# Patient Record
Sex: Female | Born: 1935 | Race: Black or African American | Hispanic: No | Smoking: Never smoker
Health system: Southern US, Community
[De-identification: ages and names within clinical notes are randomized; demographics above are authoritative.]

## PROBLEM LIST (undated history)

## (undated) DIAGNOSIS — I10 Essential (primary) hypertension: Secondary | ICD-10-CM

## (undated) DIAGNOSIS — M1990 Unspecified osteoarthritis, unspecified site: Secondary | ICD-10-CM

## (undated) DIAGNOSIS — E785 Hyperlipidemia, unspecified: Secondary | ICD-10-CM

## (undated) DIAGNOSIS — R413 Other amnesia: Secondary | ICD-10-CM

## (undated) DIAGNOSIS — F0390 Unspecified dementia without behavioral disturbance: Secondary | ICD-10-CM

## (undated) HISTORY — DX: Other amnesia: R41.3

## (undated) HISTORY — DX: Essential (primary) hypertension: I10

## (undated) HISTORY — PX: CATARACT EXTRACTION: SUR2

## (undated) HISTORY — DX: Hyperlipidemia, unspecified: E78.5

## (undated) HISTORY — DX: Unspecified osteoarthritis, unspecified site: M19.90

---

## 1978-05-08 DIAGNOSIS — I10 Essential (primary) hypertension: Secondary | ICD-10-CM

## 1978-05-08 HISTORY — DX: Essential (primary) hypertension: I10

## 1985-07-04 HISTORY — PX: ABDOMINAL HYSTERECTOMY: SHX81

## 1995-03-09 DIAGNOSIS — E785 Hyperlipidemia, unspecified: Secondary | ICD-10-CM

## 1995-03-09 HISTORY — DX: Hyperlipidemia, unspecified: E78.5

## 1999-01-12 ENCOUNTER — Other Ambulatory Visit: Admission: RE | Admit: 1999-01-12 | Discharge: 1999-01-12 | Payer: Self-pay | Admitting: Family Medicine

## 1999-12-16 ENCOUNTER — Encounter: Payer: Self-pay | Admitting: Family Medicine

## 1999-12-16 ENCOUNTER — Encounter: Admission: RE | Admit: 1999-12-16 | Discharge: 1999-12-16 | Payer: Self-pay | Admitting: Family Medicine

## 2000-12-17 ENCOUNTER — Encounter: Payer: Self-pay | Admitting: Family Medicine

## 2000-12-17 ENCOUNTER — Encounter: Admission: RE | Admit: 2000-12-17 | Discharge: 2000-12-17 | Payer: Self-pay | Admitting: Family Medicine

## 2001-06-08 ENCOUNTER — Encounter: Payer: Self-pay | Admitting: Family Medicine

## 2001-06-08 DIAGNOSIS — R739 Hyperglycemia, unspecified: Secondary | ICD-10-CM

## 2001-10-06 ENCOUNTER — Encounter: Payer: Self-pay | Admitting: Family Medicine

## 2001-12-18 ENCOUNTER — Encounter: Admission: RE | Admit: 2001-12-18 | Discharge: 2001-12-18 | Payer: Self-pay | Admitting: Family Medicine

## 2001-12-18 ENCOUNTER — Encounter: Payer: Self-pay | Admitting: Family Medicine

## 2002-07-07 ENCOUNTER — Encounter: Payer: Self-pay | Admitting: Family Medicine

## 2002-07-07 LAB — CONVERTED CEMR LAB: Hgb A1c MFr Bld: 6.2 %

## 2002-12-23 ENCOUNTER — Encounter: Admission: RE | Admit: 2002-12-23 | Discharge: 2002-12-23 | Payer: Self-pay | Admitting: Family Medicine

## 2002-12-23 ENCOUNTER — Encounter: Payer: Self-pay | Admitting: Family Medicine

## 2003-01-07 ENCOUNTER — Encounter: Payer: Self-pay | Admitting: Family Medicine

## 2003-01-07 LAB — CONVERTED CEMR LAB: Microalbumin U total vol: 2.1 mg/L

## 2003-12-25 ENCOUNTER — Encounter: Admission: RE | Admit: 2003-12-25 | Discharge: 2003-12-25 | Payer: Self-pay | Admitting: Family Medicine

## 2004-01-07 ENCOUNTER — Encounter: Payer: Self-pay | Admitting: Family Medicine

## 2004-02-03 ENCOUNTER — Other Ambulatory Visit: Admission: RE | Admit: 2004-02-03 | Discharge: 2004-02-03 | Payer: Self-pay | Admitting: Family Medicine

## 2004-04-15 ENCOUNTER — Ambulatory Visit: Payer: Self-pay | Admitting: Family Medicine

## 2004-07-06 ENCOUNTER — Encounter: Payer: Self-pay | Admitting: Family Medicine

## 2004-07-06 LAB — CONVERTED CEMR LAB: Hgb A1c MFr Bld: 5.3 %

## 2004-07-19 ENCOUNTER — Ambulatory Visit: Payer: Self-pay | Admitting: Family Medicine

## 2004-07-20 ENCOUNTER — Ambulatory Visit: Payer: Self-pay | Admitting: Family Medicine

## 2005-01-06 ENCOUNTER — Encounter: Payer: Self-pay | Admitting: Family Medicine

## 2005-01-06 LAB — CONVERTED CEMR LAB: Microalbumin U total vol: 2.1 mg/L

## 2005-01-19 ENCOUNTER — Ambulatory Visit: Payer: Self-pay | Admitting: Family Medicine

## 2005-01-24 ENCOUNTER — Ambulatory Visit: Payer: Self-pay | Admitting: Family Medicine

## 2005-01-25 ENCOUNTER — Encounter: Admission: RE | Admit: 2005-01-25 | Discharge: 2005-01-25 | Payer: Self-pay | Admitting: Family Medicine

## 2005-02-20 ENCOUNTER — Ambulatory Visit: Payer: Self-pay | Admitting: Family Medicine

## 2005-04-03 ENCOUNTER — Ambulatory Visit: Payer: Self-pay | Admitting: Family Medicine

## 2005-07-06 ENCOUNTER — Encounter: Payer: Self-pay | Admitting: Family Medicine

## 2005-07-06 LAB — CONVERTED CEMR LAB: Hgb A1c MFr Bld: 6.2 %

## 2005-07-20 ENCOUNTER — Ambulatory Visit: Payer: Self-pay | Admitting: Family Medicine

## 2005-07-24 ENCOUNTER — Ambulatory Visit: Payer: Self-pay | Admitting: Family Medicine

## 2006-01-29 ENCOUNTER — Encounter: Admission: RE | Admit: 2006-01-29 | Discharge: 2006-01-29 | Payer: Self-pay | Admitting: Family Medicine

## 2006-02-05 ENCOUNTER — Encounter: Payer: Self-pay | Admitting: Family Medicine

## 2006-02-12 ENCOUNTER — Ambulatory Visit: Payer: Self-pay | Admitting: Family Medicine

## 2006-02-22 ENCOUNTER — Ambulatory Visit: Payer: Self-pay | Admitting: Family Medicine

## 2006-03-19 ENCOUNTER — Ambulatory Visit: Payer: Self-pay | Admitting: Family Medicine

## 2006-08-07 ENCOUNTER — Encounter: Payer: Self-pay | Admitting: Family Medicine

## 2006-08-24 ENCOUNTER — Ambulatory Visit: Payer: Self-pay | Admitting: Family Medicine

## 2006-08-24 LAB — CONVERTED CEMR LAB
Basophils Absolute: 0 10*3/uL (ref 0.0–0.1)
Eosinophils Absolute: 0.1 10*3/uL (ref 0.0–0.6)
GFR calc Af Amer: 57 mL/min
GFR calc non Af Amer: 47 mL/min
HCT: 35.4 % — ABNORMAL LOW (ref 36.0–46.0)
Hemoglobin: 11.7 g/dL — ABNORMAL LOW (ref 12.0–15.0)
Hgb A1c MFr Bld: 6.1 % — ABNORMAL HIGH (ref 4.6–6.0)
MCHC: 33.1 g/dL (ref 30.0–36.0)
MCV: 84.9 fL (ref 78.0–100.0)
Monocytes Absolute: 0.5 10*3/uL (ref 0.2–0.7)
Neutro Abs: 1.7 10*3/uL (ref 1.4–7.7)
Neutrophils Relative %: 46.8 % (ref 43.0–77.0)
Potassium: 3.8 meq/L (ref 3.5–5.1)
Sodium: 145 meq/L (ref 135–145)

## 2006-08-29 ENCOUNTER — Ambulatory Visit: Payer: Self-pay | Admitting: Family Medicine

## 2007-01-24 ENCOUNTER — Encounter: Payer: Self-pay | Admitting: Family Medicine

## 2007-01-31 ENCOUNTER — Encounter: Admission: RE | Admit: 2007-01-31 | Discharge: 2007-01-31 | Payer: Self-pay | Admitting: Family Medicine

## 2007-02-04 ENCOUNTER — Encounter (INDEPENDENT_AMBULATORY_CARE_PROVIDER_SITE_OTHER): Payer: Self-pay | Admitting: *Deleted

## 2007-02-26 ENCOUNTER — Ambulatory Visit: Payer: Self-pay | Admitting: Family Medicine

## 2007-02-26 LAB — CONVERTED CEMR LAB
ALT: 16 units/L (ref 0–35)
AST: 14 units/L (ref 0–37)
Alkaline Phosphatase: 39 units/L (ref 39–117)
BUN: 18 mg/dL (ref 6–23)
Basophils Relative: 0.4 % (ref 0.0–1.0)
CO2: 27 meq/L (ref 19–32)
Calcium: 9.1 mg/dL (ref 8.4–10.5)
Chloride: 107 meq/L (ref 96–112)
Creatinine, Ser: 1.3 mg/dL — ABNORMAL HIGH (ref 0.4–1.2)
HDL: 47.4 mg/dL (ref >39.0)
Hemoglobin: 12 g/dL (ref 12.0–15.0)
LDL Cholesterol: 55 mg/dL (ref 0–99)
Monocytes Absolute: 0.7 10*3/uL (ref 0.2–0.7)
Monocytes Relative: 18 % — ABNORMAL HIGH (ref 3.0–11.0)
Potassium: 3.8 meq/L (ref 3.5–5.1)
RDW: 13.1 % (ref 11.5–14.6)
Total Bilirubin: 1.3 mg/dL — ABNORMAL HIGH (ref 0.3–1.2)
Total Protein: 7 g/dL (ref 6.0–8.3)
VLDL: 10 mg/dL (ref 0–40)

## 2007-02-28 ENCOUNTER — Ambulatory Visit: Payer: Self-pay | Admitting: Family Medicine

## 2007-02-28 DIAGNOSIS — M129 Arthropathy, unspecified: Secondary | ICD-10-CM | POA: Insufficient documentation

## 2007-03-26 ENCOUNTER — Ambulatory Visit: Payer: Self-pay | Admitting: Family Medicine

## 2007-03-27 ENCOUNTER — Encounter (INDEPENDENT_AMBULATORY_CARE_PROVIDER_SITE_OTHER): Payer: Self-pay | Admitting: *Deleted

## 2007-08-22 ENCOUNTER — Ambulatory Visit: Payer: Self-pay | Admitting: Family Medicine

## 2007-08-22 LAB — CONVERTED CEMR LAB
BUN: 20 mg/dL (ref 6–23)
Hgb A1c MFr Bld: 6.3 % — ABNORMAL HIGH (ref 4.6–6.0)

## 2007-08-26 ENCOUNTER — Ambulatory Visit: Payer: Self-pay | Admitting: Family Medicine

## 2008-02-03 ENCOUNTER — Encounter: Admission: RE | Admit: 2008-02-03 | Discharge: 2008-02-03 | Payer: Self-pay | Admitting: Family Medicine

## 2008-02-05 ENCOUNTER — Encounter (INDEPENDENT_AMBULATORY_CARE_PROVIDER_SITE_OTHER): Payer: Self-pay | Admitting: *Deleted

## 2008-02-27 ENCOUNTER — Ambulatory Visit: Payer: Self-pay | Admitting: Family Medicine

## 2008-02-27 LAB — CONVERTED CEMR LAB
ALT: 16 units/L (ref 0–35)
Albumin: 3.5 g/dL (ref 3.5–5.2)
Basophils Absolute: 0 10*3/uL (ref 0.0–0.1)
Basophils Relative: 0.1 % (ref 0.0–3.0)
Calcium: 9.3 mg/dL (ref 8.4–10.5)
Cholesterol: 105 mg/dL (ref 0–200)
Creatinine,U: 105.3 mg/dL
GFR calc non Af Amer: 43 mL/min
Glucose, Bld: 95 mg/dL (ref 70–99)
HCT: 36.7 % (ref 36.0–46.0)
Hemoglobin: 12.5 g/dL (ref 12.0–15.0)
MCHC: 34.2 g/dL (ref 30.0–36.0)
Microalb, Ur: 0.7 mg/dL (ref 0.0–1.9)
Monocytes Absolute: 0.5 10*3/uL (ref 0.1–1.0)
Monocytes Relative: 13 % — ABNORMAL HIGH (ref 3.0–12.0)
Neutro Abs: 2.3 10*3/uL (ref 1.4–7.7)
Phosphorus: 3.1 mg/dL (ref 2.3–4.6)
Potassium: 3.8 meq/L (ref 3.5–5.1)
RBC: 4.27 M/uL (ref 3.87–5.11)
RDW: 13 % (ref 11.5–14.6)
Sodium: 141 meq/L (ref 135–145)
TSH: 2.42 microintl units/mL (ref 0.35–5.50)
Total Bilirubin: 1.2 mg/dL (ref 0.3–1.2)
Triglycerides: 56 mg/dL (ref 0–149)

## 2008-03-04 ENCOUNTER — Ambulatory Visit: Payer: Self-pay | Admitting: Family Medicine

## 2008-03-27 ENCOUNTER — Ambulatory Visit: Payer: Self-pay | Admitting: Family Medicine

## 2008-03-27 LAB — CONVERTED CEMR LAB: OCCULT 3: NEGATIVE

## 2008-03-30 ENCOUNTER — Encounter (INDEPENDENT_AMBULATORY_CARE_PROVIDER_SITE_OTHER): Payer: Self-pay | Admitting: *Deleted

## 2008-08-25 ENCOUNTER — Ambulatory Visit: Payer: Self-pay | Admitting: Family Medicine

## 2008-08-25 LAB — CONVERTED CEMR LAB
CO2: 28 meq/L (ref 19–32)
Calcium: 9.2 mg/dL (ref 8.4–10.5)
Chloride: 110 meq/L (ref 96–112)
Glucose, Bld: 90 mg/dL (ref 70–99)
Potassium: 3.9 meq/L (ref 3.5–5.1)
Sodium: 143 meq/L (ref 135–145)

## 2008-09-01 ENCOUNTER — Ambulatory Visit: Payer: Self-pay | Admitting: Family Medicine

## 2009-02-04 ENCOUNTER — Encounter: Admission: RE | Admit: 2009-02-04 | Discharge: 2009-02-04 | Payer: Self-pay | Admitting: Family Medicine

## 2009-03-05 ENCOUNTER — Ambulatory Visit: Payer: Self-pay | Admitting: Family Medicine

## 2009-03-07 LAB — CONVERTED CEMR LAB
ALT: 19 units/L (ref 0–35)
AST: 17 units/L (ref 0–37)
Alkaline Phosphatase: 38 units/L — ABNORMAL LOW (ref 39–117)
Basophils Relative: 0.2 % (ref 0.0–3.0)
Bilirubin, Direct: 0 mg/dL (ref 0.0–0.3)
CO2: 30 meq/L (ref 19–32)
Chloride: 104 meq/L (ref 96–112)
Creatinine, Ser: 1.2 mg/dL (ref 0.4–1.2)
Eosinophils Relative: 3.1 % (ref 0.0–5.0)
GFR calc non Af Amer: 56.74 mL/min (ref >60)
HCT: 37.3 % (ref 36.0–46.0)
Hgb A1c MFr Bld: 5.9 % (ref 4.6–6.5)
LDL Cholesterol: 63 mg/dL (ref 0–99)
Lymphs Abs: 1.4 10*3/uL (ref 0.7–4.0)
MCV: 85.6 fL (ref 78.0–100.0)
Microalb Creat Ratio: 8.8 mg/g (ref 0.0–30.0)
Microalb, Ur: 0.7 mg/dL (ref 0.0–1.9)
Monocytes Absolute: 0.5 10*3/uL (ref 0.1–1.0)
Monocytes Relative: 13.6 % — ABNORMAL HIGH (ref 3.0–12.0)
Neutrophils Relative %: 47.4 % (ref 43.0–77.0)
Platelets: 171 10*3/uL (ref 150.0–400.0)
Potassium: 3.9 meq/L (ref 3.5–5.1)
RBC: 4.35 M/uL (ref 3.87–5.11)
Sodium: 143 meq/L (ref 135–145)
TSH: 2.45 microintl units/mL (ref 0.35–5.50)
Total Bilirubin: 0.9 mg/dL (ref 0.3–1.2)
Total CHOL/HDL Ratio: 2
Total Protein: 7.6 g/dL (ref 6.0–8.3)
Triglycerides: 68 mg/dL (ref 0.0–149.0)
Vit D, 25-Hydroxy: 13 ng/mL — ABNORMAL LOW (ref 30–89)
WBC: 3.8 10*3/uL — ABNORMAL LOW (ref 4.5–10.5)

## 2009-03-10 ENCOUNTER — Encounter: Payer: Self-pay | Admitting: Family Medicine

## 2009-03-10 ENCOUNTER — Ambulatory Visit: Payer: Self-pay | Admitting: Family Medicine

## 2009-03-10 ENCOUNTER — Other Ambulatory Visit: Admission: RE | Admit: 2009-03-10 | Discharge: 2009-03-10 | Payer: Self-pay | Admitting: Family Medicine

## 2009-03-10 DIAGNOSIS — E559 Vitamin D deficiency, unspecified: Secondary | ICD-10-CM | POA: Insufficient documentation

## 2009-03-10 LAB — CONVERTED CEMR LAB: Pap Smear: NORMAL

## 2009-03-15 ENCOUNTER — Encounter (INDEPENDENT_AMBULATORY_CARE_PROVIDER_SITE_OTHER): Payer: Self-pay | Admitting: *Deleted

## 2009-03-31 ENCOUNTER — Ambulatory Visit: Payer: Self-pay | Admitting: Family Medicine

## 2009-06-08 ENCOUNTER — Ambulatory Visit: Payer: Self-pay | Admitting: Family Medicine

## 2009-06-14 ENCOUNTER — Ambulatory Visit: Payer: Self-pay | Admitting: Family Medicine

## 2009-09-08 ENCOUNTER — Ambulatory Visit: Payer: Self-pay | Admitting: Family Medicine

## 2009-09-14 ENCOUNTER — Ambulatory Visit: Payer: Self-pay | Admitting: Family Medicine

## 2009-10-19 ENCOUNTER — Telehealth: Payer: Self-pay | Admitting: Family Medicine

## 2009-12-09 ENCOUNTER — Encounter (INDEPENDENT_AMBULATORY_CARE_PROVIDER_SITE_OTHER): Payer: Self-pay | Admitting: *Deleted

## 2010-01-20 ENCOUNTER — Encounter: Admission: RE | Admit: 2010-01-20 | Discharge: 2010-01-20 | Payer: Self-pay | Admitting: Family Medicine

## 2010-01-20 LAB — HM MAMMOGRAPHY: HM Mammogram: NEGATIVE

## 2010-01-24 ENCOUNTER — Encounter: Payer: Self-pay | Admitting: Family Medicine

## 2010-02-28 ENCOUNTER — Ambulatory Visit: Payer: Self-pay | Admitting: Family Medicine

## 2010-02-28 DIAGNOSIS — Z8739 Personal history of other diseases of the musculoskeletal system and connective tissue: Secondary | ICD-10-CM

## 2010-03-23 ENCOUNTER — Ambulatory Visit: Payer: Self-pay | Admitting: Family Medicine

## 2010-05-30 ENCOUNTER — Other Ambulatory Visit: Payer: Self-pay | Admitting: Family Medicine

## 2010-05-30 ENCOUNTER — Ambulatory Visit
Admission: RE | Admit: 2010-05-30 | Discharge: 2010-05-30 | Payer: Self-pay | Source: Home / Self Care | Attending: Family Medicine | Admitting: Family Medicine

## 2010-05-30 ENCOUNTER — Encounter: Payer: Self-pay | Admitting: Family Medicine

## 2010-05-30 LAB — URIC ACID: Uric Acid, Serum: 8.1 mg/dL — ABNORMAL HIGH (ref 2.4–7.0)

## 2010-05-30 LAB — BASIC METABOLIC PANEL
BUN: 18 mg/dL (ref 6–23)
Calcium: 9.1 mg/dL (ref 8.4–10.5)
Chloride: 111 mEq/L (ref 96–112)
Creatinine, Ser: 1.4 mg/dL — ABNORMAL HIGH (ref 0.4–1.2)

## 2010-05-30 LAB — MICROALBUMIN / CREATININE URINE RATIO: Creatinine,U: 113.8 mg/dL

## 2010-05-30 LAB — LIPID PANEL
Cholesterol: 112 mg/dL (ref 0–200)
HDL: 47.8 mg/dL (ref >39.00)
LDL Cholesterol: 50 mg/dL (ref 0–99)
Triglycerides: 70 mg/dL (ref 0.0–149.0)

## 2010-05-30 LAB — HEMOGLOBIN A1C: Hgb A1c MFr Bld: 5.9 % (ref 4.6–6.5)

## 2010-05-30 LAB — HEPATIC FUNCTION PANEL
ALT: 14 U/L (ref 0–35)
Total Bilirubin: 1.1 mg/dL (ref 0.3–1.2)
Total Protein: 6.9 g/dL (ref 6.0–8.3)

## 2010-05-30 LAB — VITAMIN B12: Vitamin B-12: 424 pg/mL (ref 211–911)

## 2010-05-31 LAB — CONVERTED CEMR LAB: Vit D, 25-Hydroxy: 27 ng/mL — ABNORMAL LOW

## 2010-06-02 ENCOUNTER — Ambulatory Visit
Admission: RE | Admit: 2010-06-02 | Discharge: 2010-06-02 | Payer: Self-pay | Source: Home / Self Care | Attending: Family Medicine | Admitting: Family Medicine

## 2010-06-02 ENCOUNTER — Encounter: Payer: Self-pay | Admitting: Family Medicine

## 2010-06-07 NOTE — Letter (Signed)
Summary: Results Follow up Letter  [place] [facility]  [address] [place], [place] [address]   Phone: [phone]  Fax: [phone]    01/24/2010 MRN: [id number]    Kristen Goodman [address] [place], [place]  [address]    Dear Ms. Farnam,  The following are the results of your recent test(s):  Test         Result    Pap Smear:        Normal _____  Not Normal _____ Comments: ______________________________________________________ Cholesterol: LDL(Bad cholesterol):         Your goal is less than:         HDL (Good cholesterol):       Your goal is more than: Comments:  ______________________________________________________ Mammogram:        Normal __X__  Not Normal _____ Comments:  Yearly follow up is recommended.   ___________________________________________________________________ Hemoccult:        Normal _____  Not normal _______ Comments:    _____________________________________________________________________ Other Tests:    We routinely do not discuss normal results over the telephone.  If you desire a copy of the results, or you have any questions about this information we can discuss them at your next office visit.   Sincerely,    Dwana Curd. Para March, M.D.  [facility]

## 2010-06-07 NOTE — Letter (Signed)
Summary: Kristen Goodman letter  Kristen Goodman at [facility]  [address] [place], [place] [address]   Phone: [phone]  Fax: [phone]       12/09/2009 MRN: [id number]  Kristen Goodman [address] [place], [place]  [address]  Dear Ms. Kinnett,  [facility] [place], and [place] announce the retirement of Arta Silence, M.D., from full-time practice at the [facility] office effective November 04, 2009 and his plans of returning part-time.  It is important to Dr. Hetty Ely and to our practice that you understand that [facility] has seven physicians in our office for your health care needs.  We will continue to offer the same exceptional care that you have today.    Dr. Hetty Ely has spoken to many of you about his plans for retirement and returning part-time in the fall.   We will continue to work with you through the transition to schedule appointments for you in the office and meet the high standards that [facility] is committed to.   Again, it is with great pleasure that we share the news that Dr. Hetty Ely will return to [facility] in October of 2011 with a reduced schedule.    If you have any questions, or would like to request an appointment with one of our physicians, please call us at [phone] and press the option for Scheduling an appointment.  We take pleasure in providing you with excellent patient care and look forward to seeing you at your next office visit.  Our [facility] Physicians are:  Tillman Abide, M.D. Laurita Quint, M.D. Roxy Manns, M.D. Kerby Nora, M.D. Hannah Beat, M.D. Ruthe Mannan, M.D. We proudly welcomed Raechel Ache, M.D. and Eustaquio Boyden, M.D. to the practice in July/August 2011.  Sincerely,  [facility]

## 2010-06-07 NOTE — Assessment & Plan Note (Signed)
Summary: 3 WK F/U PER [place]/DLO   Vital Signs:  Patient profile:   76 year old female Weight:      182.75 pounds Temp:     98.9 degrees F oral Pulse rate:   60 / minute Pulse rhythm:   regular BP sitting:   110 / 70  (left arm) Cuff size:   regular  Vitals Entered By: Sydell Axon LPN (March 23, 2010 11:41 AM) CC: 3 week follow-up on gout   History of Present Illness: Pt here in followup of gout for which she was seen by Dr Dallas Schimke and given Indocin. Her sxs have improved and she is now basically left with itching of the foot in general. She denies current pain, swelling, warmth, or erythema. She stil has swelling of the feet chronically bilat. She avoids salt and related that she tries to be active and avoid sitting for prolonged periods.  Problems Prior to Update: 1)  Acute Gouty Arthropathy  (ICD-274.01) 2)  Foot Pain, Left  (ICD-729.5) 3)  Uri  (ICD-465.9) 4)  Infection of L Lower Jaw  (ICD-526.4) 5)  Unspecified Vitamin D Deficiency  (ICD-268.9) 6)  Special Screening Malig Neoplasms Other Sites  (ICD-V76.49) 7)  Swelling, Limb  (ICD-729.81) 8)  Arthropathy Nos, Multiple Sites  (ICD-716.99) 9)  Diabetes Mellitus, Type II  (ICD-250.00) 10)  Hyperlipidemia  (ICD-272.4) 11)  Hypertension  (ICD-401.9)  Medications Prior to Update: 1)  Metoprolol Tartrate 100 Mg Tabs (Metoprolol Tartrate) .Marland Kitchen.. 1 By Mouth Two Times A Day 2)  Furosemide 40 Mg  Tabs (Furosemide) .... Take 1 Tablet By Mouth Once A Day 3)  Klor-Con M20 20 Meq  Tbcr (Potassium Chloride Crys Cr) .... 2 Tablets in Am 2 Tablets in Pm By Mouth 4)  Crestor 5 Mg Tabs (Rosuvastatin Calcium) .Marland Kitchen.. 1 Daily By Mouth 5)  Vitamin D 1000 Unit Tabs (Cholecalciferol) .... Take One By Mouth Daily 6)  Captopril 50 Mg Tabs (Captopril) .... Take One By Mouth Two Times A Day 7)  Indomethacin 50 Mg Caps (Indomethacin) .Marland Kitchen.. 1 By Mouth Three Times A Day Until Pain Improved, Then Taper Down Off Medication 8)  Amlodipine Besylate 5 Mg  Tabs (Amlodipine Besylate) .Marland Kitchen.. 1 By Mouth Daily  Current Medications (verified): 1)  Metoprolol Tartrate 100 Mg Tabs (Metoprolol Tartrate) .Marland Kitchen.. 1 By Mouth Two Times A Day 2)  Furosemide 40 Mg  Tabs (Furosemide) .... Take 1 Tablet By Mouth Once A Day 3)  Klor-Con M20 20 Meq  Tbcr (Potassium Chloride Crys Cr) .... 2 Tablets in Am 2 Tablets in Pm By Mouth 4)  Crestor 5 Mg Tabs (Rosuvastatin Calcium) .Marland Kitchen.. 1 Daily By Mouth 5)  Vitamin D 1000 Unit Tabs (Cholecalciferol) .... Take One By Mouth Daily 6)  Captopril 50 Mg Tabs (Captopril) .... Take One By Mouth Two Times A Day 7)  Indomethacin 50 Mg Caps (Indomethacin) .Marland Kitchen.. 1 By Mouth Three Times A Day Until Pain Improved, Then Taper Down Off Medication 8)  Amlodipine Besylate 5 Mg Tabs (Amlodipine Besylate) .Marland Kitchen.. 1 By Mouth Daily  Allergies: No Known Drug Allergies  Physical Exam  General:  Well-developed,well-nourished,in no acute distress; alert,appropriate and cooperative throughout examination, obese, looks more upbeat. Head:  Normocephalic and atraumatic without obvious abnormalities. No apparent alopecia or balding. Sinuses NT. Eyes:  Conjunctiva clear bilaterally.  Ears:  External ear exam shows no significant lesions or deformities.  Otoscopic examination reveals clear canals, tympanic membranes are intact bilaterally without bulging, retraction, inflammation or discharge. Hearing is grossly normal  bilaterally. Nose:  External nasal examination shows no deformity or inflammation. Nasal mucosa are pink and moist without lesions or exudates. Mouth:  Oral mucosa and oropharynx without lesions or exudates.  Teeth in mod repair. R upper gum tender and mildly inflamed. Pharynx slightly erythematous. Neck:  No deformities, masses, or tenderness noted. Lungs:  Normal respiratory effort, chest expands symmetrically. Lungs are clear to auscultation, no crackles or wheezes. Heart:  Normal rate and regular rhythm. S1 and S2 normal without gallop,  murmur, click, rub or other extra sounds. Extremities:  No clubbing, cyanosis  or deformity noted with normal full range of motion of all joints.  Some ankle swelling and some chronic congestion of the ankle area.   Impression & Recommendations:  Problem # 1:  ACUTE GOUTY ARTHROPATHY (ICD-274.01) Assessment Improved  Discussed Uric Acid and avoiding Indocin as much as possible...take only if having significant acute pain. WIll hold off Allopurinol and check Uric Acid with future bloodwork for Comp Exam. Her updated medication list for this problem includes:    Indomethacin 50 Mg Caps (Indomethacin) .Marland Kitchen... 1 by mouth three times a day until pain improved, then taper down off medication  Elevate extremity; warm compresses, symptomatic relief and medication as directed, when needed.   Problem # 2:  HYPERTENSION (ICD-401.9) Assessment: Unchanged  Her updated medication list for this problem includes:    Metoprolol Tartrate 100 Mg Tabs (Metoprolol tartrate) .Marland Kitchen... 1 by mouth two times a day    Furosemide 40 Mg Tabs (Furosemide) .Marland Kitchen... Take 1 tablet by mouth once a day    Captopril 50 Mg Tabs (Captopril) .Marland Kitchen... Take one by mouth two times a day    Amlodipine Besylate 5 Mg Tabs (Amlodipine besylate) .Marland Kitchen... 1 by mouth daily  BP today: 110/70 Prior BP: 110/70 (02/28/2010)  Labs Reviewed: K+: 3.9 (03/05/2009) Creat: : 1.2 (03/05/2009)   Chol: 132 (03/05/2009)   HDL: 55.10 (03/05/2009)   LDL: 63 (03/05/2009)   TG: 68.0 (03/05/2009)  Problem # 3:  UNSPECIFIED PRURITIC DISORDER OF R FOOT (ICD-698.9) Assessment: New ?reaction to acute arthropathy? See instructions.  Complete Medication List: 1)  Metoprolol Tartrate 100 Mg Tabs (Metoprolol tartrate) .Marland Kitchen.. 1 by mouth two times a day 2)  Furosemide 40 Mg Tabs (Furosemide) .... Take 1 tablet by mouth once a day 3)  Klor-con M20 20 Meq Tbcr (Potassium chloride crys cr) .... 2 tablets in am 2 tablets in pm by mouth 4)  Crestor 5 Mg Tabs (Rosuvastatin  calcium) .Marland Kitchen.. 1 daily by mouth 5)  Vitamin D 1000 Unit Tabs (Cholecalciferol) .... Take one by mouth daily 6)  Captopril 50 Mg Tabs (Captopril) .... Take one by mouth two times a day 7)  Indomethacin 50 Mg Caps (Indomethacin) .Marland Kitchen.. 1 by mouth three times a day until pain improved, then taper down off medication 8)  Amlodipine Besylate 5 Mg Tabs (Amlodipine besylate) .Marland Kitchen.. 1 by mouth daily  Patient Instructions: 1)  Try Eucerin Cream to foot for itching.  2)  Schedule Comp Exam with labs prior when able.   Orders Added: 1)  Est. Patient Level III [01027]    Current Allergies (reviewed today): No known allergies

## 2010-06-07 NOTE — Assessment & Plan Note (Signed)
Summary: L FOOT/CLE   Vital Signs:  Patient profile:   76 year old female Height:      61 inches Weight:      177.0 pounds BMI:     33.56 Temp:     97.8 degrees F oral Pulse rate:   60 / minute Pulse rhythm:   regular BP sitting:   110 / 70  (left arm) Cuff size:   regular  Vitals Entered By: Benny Lennert CMA Duncan Dull) (February 28, 2010 10:27 AM)  History of Present Illness: Chief complaint left foot  76 year old female with a history of acute pain in L 1st MTP and less so in 2nd MTP that started over the weekend. No inciting event or trauma at all. Able to walk with a limp. Some swelling.   No known history of gout.  No other rheum conditions.  REVIEW OF SYSTEMS  GEN: No systemic complaints, no fevers, chills, sweats, or other acute illnesses MSK: Detailed in the HPI GI: tolerating PO intake without difficulty Neuro: No numbness, parasthesias, or tingling associated. Otherwise the pertinent positives of the ROS are noted above.    GEN: Well-developed,well-nourished,in no acute distress; alert,appropriate and cooperative throughout examination HEENT: Normocephalic and atraumatic without obvious abnormalities. No apparent alopecia or balding. Ears, externally no deformities PULM: Breathing comfortably in no respiratory distress EXT: No clubbing, cyanosis. 1+ pedal edema. PSYCH: Normally interactive. Cooperative during the interview. Pleasant. Friendly and conversant. Not anxious or depressed appearing. Normal, full affect.   MSK: full ROM at ankle. TTP 1st MTP exquisitely. Mod ttp 2nd MTP. mild warmth, 1st MTP> NT all other bony anatomy. str 5/5 except 1st.   Allergies (verified): No Known Drug Allergies  Past History:  Past medical, surgical, family and social histories (including risk factors) reviewed, and no changes noted (except as noted below).  Past Medical History: Reviewed history from 01/24/2007 and no changes required. Hypertension  (05/08/1978) Hyperlipidemia (03/09/1995) Diabetes mellitus, type II (06/08/2001)  Past Surgical History: Reviewed history from 03/04/2008 and no changes required. HRT s/p partial hyst 2ndary dysmenorrhea/ menopause 2/27/ 87  Family History: Reviewed history from 03/10/2009 and no changes required. 04/06/1995  father living age 64   DM, HTN mother died 05-15-2022 age 43 alzheimer's, P.E., HTN Brothers A 69  DM Brother A 60 DM Brother A 69 Brother A 50 Brother A 45 Sister 1 died 12/24/97 ?MI Sister A 69 CV + sister? HBP + father/mother DM + father 2 brothers CA neg depression neg ETOH/Drug abuse neg Stroke neg  Social History: Reviewed history from 09/14/2009 and no changes required. 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 no children   Impression & Recommendations:  Problem # 1:  ACUTE GOUTY ARTHROPATHY (ICD-274.01) Assessment New clinically c/w gout vs. pseudogout.  indocin if failure, would add colchicine  d/c HCTZ will need f/u regarding BP changes in meds  Her updated medication list for this problem includes:    Indomethacin 50 Mg Caps (Indomethacin) .Marland Kitchen... 1 by mouth three times a day until pain improved, then taper down off medication  Problem # 2:  FOOT PAIN, LEFT (ICD-729.5) XR, 3 view, foot series Indication: foot pain Findings: no evidence of acute fracture or dislocation   in dm, no evidence of fx  Orders: T-Foot Left Min 3 Views (73630TC)  Problem # 3:  HYPERTENSION (ICD-401.9) d/c HCTZ in face of gout  Her updated medication list for this problem includes:    Metoprolol Tartrate  100 Mg Tabs (Metoprolol tartrate) .Marland Kitchen... 1 by mouth two times a day    Furosemide 40 Mg Tabs (Furosemide) .Marland Kitchen... Take 1 tablet by mouth once a day    Captopril 50 Mg Tabs (Captopril) .Marland Kitchen... Take one by mouth two times a day    Amlodipine Besylate 5 Mg Tabs (Amlodipine besylate) .Marland Kitchen... 1 by mouth daily  Complete Medication  List: 1)  Metoprolol Tartrate 100 Mg Tabs (Metoprolol tartrate) .Marland Kitchen.. 1 by mouth two times a day 2)  Furosemide 40 Mg Tabs (Furosemide) .... Take 1 tablet by mouth once a day 3)  Klor-con M20 20 Meq Tbcr (Potassium chloride crys cr) .... 2 tablets in am 2 tablets in pm by mouth 4)  Crestor 5 Mg Tabs (Rosuvastatin calcium) .Marland Kitchen.. 1 daily by mouth 5)  Vitamin D 1000 Unit Tabs (Cholecalciferol) .... Take one by mouth daily 6)  Captopril 50 Mg Tabs (Captopril) .... Take one by mouth two times a day 7)  Indomethacin 50 Mg Caps (Indomethacin) .Marland Kitchen.. 1 by mouth three times a day until pain improved, then taper down off medication 8)  Amlodipine Besylate 5 Mg Tabs (Amlodipine besylate) .Marland Kitchen.. 1 by mouth daily  Other Orders: Influenza Vaccine MCR (32202)  Patient Instructions: 1)  STOP LOPRESSOR HCT 2)  TAKE THE INDOMETHACIN 1 by mouth three times a day UNTIL YOU START FEELING BETTER, IF NOT FEELING IMPROVED BY WEDNESDAY, CALL OUR OFFICE. 3)  Blood pressure medicine change, f/u 3 weeks with Dr. Hetty Ely Prescriptions: AMLODIPINE BESYLATE 5 MG TABS (AMLODIPINE BESYLATE) 1 by mouth daily  #30 x 5   Entered and Authorized by:   Hannah Beat MD   Signed by:   Hannah Beat MD on 02/28/2010   Method used:   [facility] to        [facility] [phone][facility] (retail)       [address] [place], [place]  [address]       Ph: [phone] or [phone]       Fax: [phone]   RxID:   [id number] METOPROLOL TARTRATE 100 MG TABS (METOPROLOL TARTRATE) 1 by mouth two times a day  #60 x 11   Entered and Authorized by:   Hannah Beat MD   Signed by:   Hannah Beat MD on 02/28/2010   Method used:   Electronically to        [facility] [phone]* (retail)       [address] [place], [place]  [address]       Ph: [phone] or [phone]       Fax: [phone]   RxID:   [id number] INDOMETHACIN 50 MG CAPS (INDOMETHACIN) 1 by mouth three  times a day until pain improved, then taper down off medication  #30 x 0   Entered and Authorized by:   Hannah Beat MD   Signed by:   Hannah Beat MD on 02/28/2010   Method used:   Electron[facility] to        [facility] [phone]* (retail)       [address] [place], [place]  [address]       Ph: [phone] or [phone]       Fax: [phone]   RxID:  [id number]    Orders Added: 1)  Influenza Vaccine MCR [00025] 2)  T-Foot Left Min 3 Views [73630TC] 3)  Est. Patient Level IV [88416]   Immunizations Administered:  Influenza Vaccine # 1:    Vaccine Type: Fluvax MCR    Site: left deltoid    Mfr: GlaxoSmithKline    Dose: 0.5 ml    Route: IM    Given by: Benny Lennert CMA (AAMA)    Exp. Date: 11/05/2010    Lot #: [id number]    VIS given: 11/30/09 version given February 28, 2010.  Flu Vaccine Consent Questions:    Do you have a history of severe allergic reactions to this vaccine? no    Any prior history of allergic reactions to egg and/or gelatin? no    Do you have a sensitivity to the preservative Thimersol? no    Do you have a past history of Guillan-Barre Syndrome? no    Do you currently have an acute febrile illness? no    Have you ever had a severe reaction to latex? no    Vaccine information given and explained to patient? yes    Are you currently pregnant? no   Immunizations Administered:  Influenza Vaccine # 1:    Vaccine Type: Fluvax MCR    Site: left deltoid    Mfr: GlaxoSmithKline    Dose: 0.5 ml    Route: IM    Given by: Benny Lennert CMA (AAMA)    Exp. Date: 11/05/2010    Lot #: [id number]    VIS given: 11/30/09 version given February 28, 2010.  Current Allergies (reviewed today): No known allergies

## 2010-06-07 NOTE — Assessment & Plan Note (Signed)
Summary: 3 MONTH FOLLOW UP/RBH   Vital Signs:  Patient profile:   76 year old female Weight:      173 pounds BMI:     32.81 Temp:     98.2 degrees F oral Pulse rate:   60 / minute Pulse rhythm:   regular BP sitting:   104 / 64  (left arm) Cuff size:   regular  Vitals Entered By: Sydell Axon LPN (June 14, 2009 10:10 AM) CC: 3 Month follow-up after labs   History of Present Illness: Pt here for followup of Vit D deficiency. She feels better in general but feels her legs are weak at timesa ltho it goes away quickly. She walks to the mailbox but ingeneral does very little else. he has no other complaints.  Problems Prior to Update: 1)  Infection of L Lower Jaw  (ICD-526.4) 2)  Unspecified Vitamin D Deficiency  (ICD-268.9) 3)  Special Screening Malig Neoplasms Other Sites  (ICD-V76.49) 4)  Swelling, Limb  (ICD-729.81) 5)  Arthropathy Nos, Multiple Sites  (ICD-716.99) 6)  Diabetes Mellitus, Type II  (ICD-250.00) 7)  Hyperlipidemia  (ICD-272.4) 8)  Hypertension  (ICD-401.9)  Medications Prior to Update: 1)  Lopressor Hct 100-25 Mg  Tabs (Metoprolol-Hydrochlorothiazide) .... Take 1 Tablet By Mouth Two Times A Day 2)  Furosemide 40 Mg  Tabs (Furosemide) .... Take 1 Tablet By Mouth Once A Day 3)  Capoten 50 Mg  Tabs (Captopril) .... Take 1 Tablet By Mouth Two Times A Day 4)  Klor-Con M20 20 Meq  Tbcr (Potassium Chloride Crys Cr) .... 2 Tablets in Am 2 Tablets in Pm By Mouth 5)  Crestor 5 Mg Tabs (Rosuvastatin Calcium) .Marland Kitchen.. 1 Daily By Mouth 6)  Ergocalciferol 50000 Unit Caps (Ergocalciferol) .... One Tab By Mouth Weekly. 7)  Amoxicillin 500 Mg Caps (Amoxicillin) .... 2tabs By Mouth Two Times A Day  Allergies: No Known Drug Allergies  Physical Exam  General:  Well-developed,well-nourished,in no acute distress; alert,appropriate and cooperative throughout examination, obese, looks more upbeat. Head:  Normocephalic and atraumatic without obvious abnormalities. No apparent alopecia  or balding. Sinuses NT. Eyes:  Conjunctiva clear bilaterally.  Ears:  External ear exam shows no significant lesions or deformities.  Otoscopic examination reveals clear canals, tympanic membranes are intact bilaterally without bulging, retraction, inflammation or discharge. Hearing is grossly normal bilaterally. Nose:  External nasal examination shows no deformity or inflammation. Nasal mucosa are pink and moist without lesions or exudates. Mouth:  Oral mucosa and oropharynx without lesions or exudates.  Teeth in mod repair. R upper gum tender and mildly inflamed.   Impression & Recommendations:  Problem # 1:  UNSPECIFIED VITAMIN D DEFICIENCY (ICD-268.9) Assessment Improved Lvl 44 from 13. Now swittch from weekly to twice a day 1000Iu.   Problem # 2:  ARTHROPATHY NOS, MULTIPLE SITES (ICD-716.99) Assessment: Unchanged Leg weakness today. Shown exercises top do and suggest regular walking, starting slow and gradually increasing.  Complete Medication List: 1)  Lopressor Hct 100-25 Mg Tabs (Metoprolol-hydrochlorothiazide) .... Take 1 tablet by mouth two times a day 2)  Furosemide 40 Mg Tabs (Furosemide) .... Take 1 tablet by mouth once a day 3)  Capoten 50 Mg Tabs (Captopril) .... Take 1 tablet by mouth two times a day 4)  Klor-con M20 20 Meq Tbcr (Potassium chloride crys cr) .... 2 tablets in am 2 tablets in pm by mouth 5)  Crestor 5 Mg Tabs (Rosuvastatin calcium) .Marland Kitchen.. 1 daily by mouth 6)  Ergocalciferol 50000 Unit Caps (Ergocalciferol) .Marland KitchenMarland KitchenMarland Kitchen  One tab by mouth weekly.  Patient Instructions: 1)  Now start Vit D 1000 Iu two times a day. 2)  RTC 3 mos, Vit D lvl prior 268.9 3)  Start regular walking and exercises for the legs.  Current Allergies (reviewed today): No known allergies

## 2010-06-07 NOTE — Progress Notes (Signed)
Summary: Rx Captopril  Phone Note Refill Request Call back at [phone] Message from:  [facility] on October 19, 2009 2:50 PM  Refills Requested: Medication #1:  CAPTOPRIL 50 MG TABS Take one by mouth two times a day.  Method Requested: Electronic Initial call taken by: Sydell Axon LPN,  October 19, 2009 2:49 PM    New/Updated Medications: CAPTOPRIL 50 MG TABS (CAPTOPRIL) Take one by mouth two times a day Prescriptions: CAPTOPRIL 50 MG TABS (CAPTOPRIL) Take one by mouth two times a day  #60 x 12   Entered and Authorized by:   Shaune Leeks MD   Signed by:   Shaune Leeks MD on 10/19/2009   Method used:   [facility] to        [facility] [phone]* (retail)       [address] [place], [place]  [address]       Ph: [phone] or [phone]       Fax: [phone]   RxID:   [id number]

## 2010-06-07 NOTE — Assessment & Plan Note (Signed)
Summary: 3 MONTH FOLLOW UP/[facility]   Vital Signs:  Patient profile:   76 year old female Weight:      177 pounds Temp:     98.4 degrees F oral Pulse rate:   60 / minute Pulse rhythm:   regular BP sitting:   112 / 68  (left arm) Cuff size:   regular  Vitals Entered By: Sydell Axon LPN (Sep 14, 2009 10:10 AM) CC: 3 month follow-up, has a productive cough/white at times   History of Present Illness: Pt here for followup of Vit D. She has had a couple days of congestion and upper chest congestiion. She has had headache in the top of the head. She has not had fever, has not had chills, no ear pain, mild rhinitis that is white, scratchy noyt sore Throat, comes and goes with hoarseness, cough that is productive of white mucous, no SOB, no N/V.  She has taken Tyl only.  Problems Prior to Update: 1)  Infection of L Lower Jaw  (ICD-526.4) 2)  Unspecified Vitamin D Deficiency  (ICD-268.9) 3)  Special Screening Malig Neoplasms Other Sites  (ICD-V76.49) 4)  Swelling, Limb  (ICD-729.81) 5)  Arthropathy Nos, Multiple Sites  (ICD-716.99) 6)  Diabetes Mellitus, Type II  (ICD-250.00) 7)  Hyperlipidemia  (ICD-272.4) 8)  Hypertension  (ICD-401.9)  Medications Prior to Update: 1)  Lopressor Hct 100-25 Mg  Tabs (Metoprolol-Hydrochlorothiazide) .... Take 1 Tablet By Mouth Two Times A Day 2)  Furosemide 40 Mg  Tabs (Furosemide) .... Take 1 Tablet By Mouth Once A Day 3)  Capoten 50 Mg  Tabs (Captopril) .... Take 1 Tablet By Mouth Two Times A Day 4)  Klor-Con M20 20 Meq  Tbcr (Potassium Chloride Crys Cr) .... 2 Tablets in Am 2 Tablets in Pm By Mouth 5)  Crestor 5 Mg Tabs (Rosuvastatin Calcium) .Marland Kitchen.. 1 Daily By Mouth 6)  Ergocalciferol 50000 Unit Caps (Ergocalciferol) .... One Tab By Mouth Weekly.  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 no children  Physical Exam  General:   Well-developed,well-nourished,in no acute distress; alert,appropriate and cooperative throughout examination, obese, looks more upbeat. Head:  Normocephalic and atraumatic without obvious abnormalities. No apparent alopecia or balding. Sinuses NT. Eyes:  Conjunctiva clear bilaterally.  Ears:  External ear exam shows no significant lesions or deformities.  Otoscopic examination reveals clear canals, tympanic membranes are intact bilaterally without bulging, retraction, inflammation or discharge. Hearing is grossly normal bilaterally. Nose:  External nasal examination shows no deformity or inflammation. Nasal mucosa are pink and moist without lesions or exudates. Mouth:  Oral mucosa and oropharynx without lesions or exudates.  Teeth in mod repair. R upper gum tender and mildly inflamed. Pharynx slightly erythematous. Neck:  No deformities, masses, or tenderness noted. Lungs:  Normal respiratory effort, chest expands symmetrically. Lungs are clear to auscultation, no crackles or wheezes. Heart:  Normal rate and regular rhythm. S1 and S2 normal without gallop, murmur, click, rub or other extra sounds.   Impression & Recommendations:  Problem # 1:  URI (ICD-465.9) Assessment New See instructions. Instructed on symptomatic treatment. Call if symptoms persist or worsen.   Problem # 2:  UNSPECIFIED VITAMIN D DEFICIENCY (ICD-268.9) Assessment: Unchanged Increase replacement to 1000Iu two times a day, level is slowly decreasing.  Problem # 3:  HYPERTENSION (ICD-401.9) Assessment: Unchanged  Her updated medication list for this problem includes:    Lopressor Hct 100-25 Mg Tabs (  Metoprolol-hydrochlorothiazide) .Marland Kitchen... Take 1 tablet by mouth two times a day    Furosemide 40 Mg Tabs (Furosemide) .Marland Kitchen... Take 1 tablet by mouth once a day    Capoten 50 Mg Tabs (Captopril) .Marland Kitchen... Take 1 tablet by mouth two times a day  BP today: 112/68 Prior BP: 104/64 (06/14/2009)  Labs Reviewed: K+: 3.9  (03/05/2009) Creat: : 1.2 (03/05/2009)   Chol: 132 (03/05/2009)   HDL: 55.10 (03/05/2009)   LDL: 63 (03/05/2009)   TG: 68.0 (03/05/2009)  Complete Medication List: 1)  Lopressor Hct 100-25 Mg Tabs (Metoprolol-hydrochlorothiazide) .... Take 1 tablet by mouth two times a day 2)  Furosemide 40 Mg Tabs (Furosemide) .... Take 1 tablet by mouth once a day 3)  Capoten 50 Mg Tabs (Captopril) .... Take 1 tablet by mouth two times a day 4)  Klor-con M20 20 Meq Tbcr (Potassium chloride crys cr) .... 2 tablets in am 2 tablets in pm by mouth 5)  Crestor 5 Mg Tabs (Rosuvastatin calcium) .Marland Kitchen.. 1 daily by mouth 6)  Vitamin D 1000 Unit Tabs (Cholecalciferol) .... Take one by mouth daily  Patient Instructions: 1)  Take Guaifenesin by going to [facility] or [facility] and getting MUCOUS RELIEF EXPECTORANT (400mg ), take 11/2 tabs by mouth AM and NOON. 2)  Drink lots of fluids anytime taking Guaifenesin.  3)  Tyl ES (500mg ) 2 tabs by mouth three times a day. 4)  Gargle with warm salt water every half hour for two days. 5)  Keep lozenge in mouth. 6)  RTC Nov/Dec fopr recheck.   Current Allergies (reviewed today): No known allergies

## 2010-06-09 NOTE — Assessment & Plan Note (Signed)
Summary: CPX/[facility]   Vital Signs:  Patient profile:   76 year old female Weight:      176 pounds Temp:     97.1 degrees F oral Pulse rate:   60 / minute Pulse rhythm:   regular BP sitting:   104 / 64  (left arm) Cuff size:   large  Vitals Entered By: Sydell Axon LPN (June 02, 2010 3:08 PM) CC: 30 Minute checkup  Vision Screening:Left eye with correction: 20 / 30 Right eye with correction: 20 / 30 Both eyes with correction: 20 / 30       25db HL: Left  500 hz: No Response 1000 hz: 25db 2000 hz: 25db 4000 hz: 25db Right  500 hz: No Response 1000 hz: 25db 2000 hz: 25db 4000 hz: 25db    History of Present Illness: Pt here for Comp Exam. She has neck and knee pain which is chronic. She is under stress as usual. Her father is having problems with his legs and she must stay up there with him. She overall is doing quite well, given the circumstances.  Preventive Screening-Counseling & Management  Alcohol-Tobacco     Alcohol drinks/day: 0     Smoking Status: never     Passive Smoke Exposure: no  Caffeine-Diet-Exercise     Caffeine use/day: 2     Does Patient Exercise: no  Problems Prior to Update: 1)  Unspecified Pruritic Disorder of R Foot  (ICD-698.9) 2)  Acute Gouty Arthropathy  (ICD-274.01) 3)  Foot Pain, Left  (ICD-729.5) 4)  Unspecified Vitamin D Deficiency  (ICD-268.9) 5)  Special Screening Malig Neoplasms Other Sites  (ICD-V76.49) 6)  Swelling, Limb  (ICD-729.81) 7)  Arthropathy Nos, Multiple Sites  (ICD-716.99) 8)  Diabetes Mellitus, Type II  (ICD-250.00) 9)  Hyperlipidemia  (ICD-272.4) 10)  Hypertension  (ICD-401.9)  Medications Prior to Update: 1)  Metoprolol Tartrate 100 Mg Tabs (Metoprolol Tartrate) .Marland Kitchen.. 1 By Mouth Two Times A Day 2)  Furosemide 40 Mg  Tabs (Furosemide) .... Take 1 Tablet By Mouth Once A Day 3)  Klor-Con M20 20 Meq  Tbcr (Potassium Chloride Crys Cr) .... 2 Tablets in Am 2 Tablets in Pm By Mouth 4)  Crestor 5 Mg Tabs (Rosuvastatin  Calcium) .Marland Kitchen.. 1 Daily By Mouth 5)  Vitamin D 1000 Unit Tabs (Cholecalciferol) .... Take One By Mouth Daily 6)  Captopril 50 Mg Tabs (Captopril) .... Take One By Mouth Two Times A Day 7)  Indomethacin 50 Mg Caps (Indomethacin) .Marland Kitchen.. 1 By Mouth Three Times A Day Until Pain Improved, Then Taper Down Off Medication 8)  Amlodipine Besylate 5 Mg Tabs (Amlodipine Besylate) .Marland Kitchen.. 1 By Mouth Daily  Current Medications (verified): 1)  Metoprolol Tartrate 100 Mg Tabs (Metoprolol Tartrate) .Marland Kitchen.. 1 By Mouth Two Times A Day 2)  Furosemide 40 Mg  Tabs (Furosemide) .... Take 1 Tablet By Mouth Once A Day 3)  Klor-Con M20 20 Meq  Tbcr (Potassium Chloride Crys Cr) .... 2 Tablets in Am 2 Tablets in Pm By Mouth 4)  Crestor 5 Mg Tabs (Rosuvastatin Calcium) .Marland Kitchen.. 1 Daily By Mouth 5)  Captopril 50 Mg Tabs (Captopril) .... Take One By Mouth Two Times A Day 6)  Indomethacin 50 Mg Caps (Indomethacin) .Marland Kitchen.. 1 By Mouth Three Times A Day Until Pain Improved, Then Taper Down Off Medication 7)  Amlodipine Besylate 5 Mg Tabs (Amlodipine Besylate) .Marland Kitchen.. 1 By Mouth Daily  Allergies: No Known Drug Allergies  Past History:  Past Medical History: Last updated: 01/24/2007 Hypertension (  05/08/1978) Hyperlipidemia (03/09/1995) Diabetes mellitus, type II (06/08/2001)  Family History: Last updated: 06/02/2010 04/06/1995  father A age 50   DM, HTN mother died 05/13/22 age 110 alzheimer's, P.E., HTN Brothers A 64  DM Brother A 38 DM Brother A 62 Brother A 50 Brother A 45 Sister 1 died Dec 22, 1997 ?MI Sister A 70 CV + sister? 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 HBP + father/mother DM + father 2 brothers CA neg depression neg ETOH/Drug abuse neg Stroke neg  Social History: Caffeine use/day:  2  Review of Systems General:  Denies chills, fatigue, fever, sweats, weakness, and weight loss. Eyes:  Denies blurring, discharge, and eye pain. ENT:  Denies decreased hearing, ear discharge, earache, and ringing in ears. CV:  Complains of swelling of feet; denies chest pain or discomfort, fainting, palpitations, shortness of breath with exertion, and swelling of hands. Resp:  Denies cough, shortness of breath, and wheezing. GI:  Denies abdominal pain, bloody stools, change in bowel habits, constipation, dark tarry stools, diarrhea, indigestion, loss of appetite, nausea, vomiting, vomiting blood, and yellowish skin color. GU:  Complains of nocturia; denies discharge, dysuria, and urinary frequency; once to none. Derm:  Denies dryness, itching, and rash. Neuro:  Denies numbness, poor balance, tingling, and tremors.  Physical Exam  General:  Well-developed,well-nourished,in no acute distress; alert,appropriate and cooperative throughout examination, obese, looks more upbeat. Head:  Normocephalic and atraumatic without obvious abnormalities. No apparent alopecia or balding. Sinuses NT. Eyes:  Conjunctiva clear bilaterally.  Ears:  External ear exam shows no significant lesions or deformities.  Otoscopic examination reveals clear canals, tympanic membranes are intact bilaterally without bulging, retraction, inflammation or discharge. Hearing is grossly normal bilaterally. Nose:  External nasal examination shows no  deformity or inflammation. Nasal mucosa are pink and moist without lesions or exudates. Mouth:  Oral mucosa and oropharynx without lesions or exudates.  Teeth in mod repair. Neck:  No deformities, masses, or tenderness noted. Chest Wall:  No deformities, masses, or tenderness noted. Breasts:  No mass, nodules, thickening, tenderness, bulging, retraction, inflamation, nipple discharge or skin changes noted.  inverted nipples chronicallly. Lungs:  Normal respiratory effort, chest expands symmetrically. Lungs are clear to auscultation, no crackles or wheezes. Heart:  Normal rate and regular rhythm. S1 and S2 normal without gallop, murmur, click, rub or other extra sounds. Abdomen:  Bowel sounds positive,abdomen soft and non-tender without masses, organomegaly or hernias noted. Obese and protuberant. Rectal:  No external abnormalities noted. Normal sphincter tone. No rectal masses or tenderness. G neg. Genitalia:  Not done. Msk:  Low Back NT to palpation. ROM limited in flex/ext...some MSK, some obesity. Hips tight with assistance needed to get on/off exam table. Pulses:  R and L carotid,radial,femoral,dorsalis pedis and posterior tibial pulses are full and equal bilaterally  Extremities:  No clubbing, cyanosis  or deformity noted with normal full range of motion of all joints.  Some ankle swelling and some chronic congestion of the ankle area. Neurologic:  No cranial nerve deficits noted. Station and gait are normal. Sensory, motor and coordinative functions appear intact. Skin:  Intact without suspicious lesions or rashes, multiple benign moles throughout. Cervical Nodes:  No lymphadenopathy noted Axillary Nodes:  No palpable lymphadenopathy Inguinal Nodes:  No significant adenopathy Psych:  Cognition and judgment appear intact. Alert and cooperative with normal attention span and concentration. No apparent delusions, illusions, hallucinations   Impression & Recommendations:  Problem # 1:  HEALTH  MAINTENANCE EXAM (ICD-V70.0)  I have personally reviewed the Medicare Annual Wellness questionnaire and have noted 1.   The patient's medical and social history 2.   Their use of alcohol, tobacco or illicit drugs 3.   Their current medications and supplements 4.   The patient's functional ability including ADL's, fall risks, home safety risks and hearing or visual             impairment. 5.   Diet and physical activities 6.   Evidence for depression or mood disorders  Orders: Medicare -1st Annual Wellness Visit [phone])  Problem # 2:  SWELLING, LIMBS (ICD-729.81) Assessment: Unchanged Goes up and down..."cannot get support hose on" Do not want to increase diuretic due to renal insuff. Discussed further salt restriction. Elevate feet when able. Prefer her to use support hose.  Problem # 3:  ACUTE GOUTY ARTHROPATHY (ICD-274.01) Assessment: Unchanged Stable. Her updated medication list for this problem includes:    Indomethacin 50 Mg Caps (Indomethacin) .Marland Kitchen... 1 by mouth three times a day until pain improved, then taper down off medication  Problem # 4:  UNSPECIFIED VITAMIN D DEFICIENCY (ICD-268.9) Assessment: Unchanged Increase Vit D to two times a day (hasn't been taking)  Problem # 5:  DIABETES MELLITUS, TYPE II (ICD-250.00) Assessment: Improved  Great control. Her updated medication list for this problem includes:    Captopril 50 Mg Tabs (Captopril) .Marland Kitchen... Take one by mouth two times a day  Labs Reviewed: Creat: 1.4 (05/30/2010)   Microalbumin: 2.1 (01/06/2005) Reviewed HgBA1c results: 5.9 (05/30/2010)  5.9 (03/05/2009)  Problem # 6:  HYPERLIPIDEMIA (ICD-272.4) Assessment: Unchanged  Great nos. Cont Crestor. Her updated medication list for this problem includes:    Crestor 5 Mg Tabs (Rosuvastatin calcium) .Marland Kitchen... 1 daily by mouth  Labs Reviewed: SGOT: 15 (05/30/2010)   SGPT: 14 (05/30/2010)   HDL:47.80 (05/30/2010), 55.10 (03/05/2009)  LDL:50 (05/30/2010), 63 (03/05/2009)   Chol:112 (05/30/2010), 132 (03/05/2009)  Trig:70.0 (05/30/2010), 68.0 (03/05/2009)  Problem # 7:  HYPERTENSION (ICD-401.9) Assessment: Improved Stable. Her updated medication list for this problem includes:    Metoprolol Tartrate 100 Mg Tabs (Metoprolol tartrate) .Marland Kitchen... 1 by mouth two times a day    Furosemide 40 Mg Tabs (Furosemide) .Marland Kitchen... Take 1 tablet by mouth once a day    Captopril 50 Mg Tabs (Captopril) .Marland Kitchen... Take one by mouth two times a day    Amlodipine Besylate 5 Mg Tabs (Amlodipine besylate) .Marland Kitchen... 1 by mouth daily  BP today: 104/64 Prior BP: 110/70 (03/23/2010)  Labs Reviewed: K+: 4.2 (05/30/2010) Creat: : 1.4 (05/30/2010)   Chol: 112 (05/30/2010)   HDL: 47.80 (05/30/2010)   LDL: 50 (05/30/2010)   TG: 70.0 (05/30/2010)  Complete Medication List: 1)  Metoprolol Tartrate 100 Mg Tabs (Metoprolol tartrate) .Marland Kitchen.. 1 by mouth two times a day 2)  Furosemide 40 Mg Tabs (Furosemide) .Marland KitchenMarland KitchenMarland Kitchen  Take 1 tablet by mouth once a day 3)  Klor-con M20 20 Meq Tbcr (Potassium chloride crys cr) .... 2 tablets in am 2 tablets in pm by mouth 4)  Crestor 5 Mg Tabs (Rosuvastatin calcium) .Marland Kitchen.. 1 daily by mouth 5)  Captopril 50 Mg Tabs (Captopril) .... Take one by mouth two times a day 6)  Indomethacin 50 Mg Caps (Indomethacin) .Marland Kitchen.. 1 by mouth three times a day until pain improved, then taper down off medication 7)  Amlodipine Besylate 5 Mg Tabs (Amlodipine besylate) .Marland Kitchen.. 1 by mouth daily  Other Orders: Audiometry [phone]) Vision Screening ([id number])  Patient Instructions: 1)  RTC 6 mos, A1C prior. 2)  Start Fish Oil for neck pain. 3)  Again encouraged to get eye exam.   Orders Added: 1)  Audiometry [92552] 2)  Vision Screening [14782] 3)  Medicare -1st Annual Wellness Visit [G0438]    Current Allergies (reviewed today): No known allergies

## 2010-06-15 NOTE — Letter (Signed)
Summary: Medicare Wellness Questionnaire  Medicare Wellness Questionnaire   Imported By: Maryln Gottron 06/10/2010 14:35:44  _____________________________________________________________________  External Attachment:    Type:   Image     Comment:   External Document

## 2010-08-25 ENCOUNTER — Other Ambulatory Visit: Payer: Self-pay | Admitting: Family Medicine

## 2010-10-14 ENCOUNTER — Other Ambulatory Visit: Payer: Self-pay | Admitting: Family Medicine

## 2010-11-02 ENCOUNTER — Other Ambulatory Visit: Payer: Self-pay | Admitting: Family Medicine

## 2010-11-12 ENCOUNTER — Encounter: Payer: Self-pay | Admitting: Family Medicine

## 2010-11-15 ENCOUNTER — Other Ambulatory Visit: Payer: Self-pay | Admitting: Family Medicine

## 2010-11-15 DIAGNOSIS — E119 Type 2 diabetes mellitus without complications: Secondary | ICD-10-CM

## 2010-11-16 ENCOUNTER — Other Ambulatory Visit: Payer: Self-pay

## 2010-11-18 ENCOUNTER — Other Ambulatory Visit (INDEPENDENT_AMBULATORY_CARE_PROVIDER_SITE_OTHER): Payer: Medicare Other | Admitting: Family Medicine

## 2010-11-18 DIAGNOSIS — E119 Type 2 diabetes mellitus without complications: Secondary | ICD-10-CM

## 2010-11-18 LAB — BASIC METABOLIC PANEL
BUN: 18 mg/dL (ref 6–23)
CO2: 26 mEq/L (ref 19–32)
Chloride: 110 mEq/L (ref 96–112)
Creatinine, Ser: 1.2 mg/dL (ref 0.4–1.2)
Potassium: 3.9 mEq/L (ref 3.5–5.1)

## 2010-11-23 ENCOUNTER — Encounter: Payer: Self-pay | Admitting: Family Medicine

## 2010-11-23 ENCOUNTER — Ambulatory Visit (INDEPENDENT_AMBULATORY_CARE_PROVIDER_SITE_OTHER): Payer: Medicare Other | Admitting: Family Medicine

## 2010-11-23 DIAGNOSIS — I10 Essential (primary) hypertension: Secondary | ICD-10-CM

## 2010-11-23 DIAGNOSIS — N289 Disorder of kidney and ureter, unspecified: Secondary | ICD-10-CM

## 2010-11-23 DIAGNOSIS — M129 Arthropathy, unspecified: Secondary | ICD-10-CM

## 2010-11-23 DIAGNOSIS — E119 Type 2 diabetes mellitus without complications: Secondary | ICD-10-CM

## 2010-11-23 DIAGNOSIS — E559 Vitamin D deficiency, unspecified: Secondary | ICD-10-CM

## 2010-11-23 NOTE — Progress Notes (Signed)
  Subjective:    Patient ID: Kristen Goodman, female    DOB: [birth year], 76 y.o.   MRN: [id number]  HPI Pt here as six month followup from PE when had swelling of the feet and ankles. She is to be wearing support hose, but she doesn't.  Diabetes was well controlled (needed eye exam), BP great and Vit D was low because she had stopped replacement. She still had her chronic knee and neck pain and was told to try Fish Oil. Her neck is better her knee continues to bother her but no worse..     Review of Systems  Constitutional: Negative for fever, chills, diaphoresis, activity change and fatigue.  HENT: Negative for ear pain, congestion, rhinorrhea and postnasal drip.   Eyes: Negative for redness.  Respiratory: Negative for cough, chest tightness, shortness of breath and wheezing.   Cardiovascular: Negative for chest pain.       Objective:   Physical Exam  Constitutional: She appears well-developed and well-nourished. No distress.  HENT:  Head: Normocephalic and atraumatic.  Right Ear: External ear normal.  Left Ear: External ear normal.  Nose: Nose normal.  Mouth/Throat: Oropharynx is clear and moist. No oropharyngeal exudate.  Eyes: Conjunctivae and EOM are normal. Pupils are equal, round, and reactive to light.  Neck: Normal range of motion. Neck supple. No thyromegaly present.  Cardiovascular: Normal rate, regular rhythm and normal heart sounds.   Pulmonary/Chest: Effort normal and breath sounds normal. She has no wheezes. She has no rales.  Musculoskeletal: She exhibits edema (minimal today versus last time. In feet only.).  Lymphadenopathy:    She has no cervical adenopathy.  Skin: She is not diaphoretic.       A few benign moles evaled on the lower legs bilat.          Assessment & Plan:

## 2010-11-23 NOTE — Assessment & Plan Note (Signed)
Continued good control. Discussed good diet and try to exercise. Lab Results  Component Value Date   HGBA1C 6.0 11/18/2010

## 2010-11-23 NOTE — Assessment & Plan Note (Signed)
Great control. Cont curr meds. BP Readings from Last 3 Encounters:  11/23/10 100/60  06/02/10 104/64  03/23/10 110/70

## 2010-11-23 NOTE — Assessment & Plan Note (Signed)
Has been encouraged to start Vit D replacement.

## 2010-11-23 NOTE — Patient Instructions (Signed)
Call after the beginning of the year for PE appt to establish with new doctor.

## 2010-11-23 NOTE — Assessment & Plan Note (Signed)
Mild at this point but don't want to increase diuretics in order to keep from progressing.

## 2010-11-23 NOTE — Assessment & Plan Note (Signed)
No progression. Cont prn meds. Avoid NSAIDs due to mild kidney compromise.

## 2011-01-02 ENCOUNTER — Other Ambulatory Visit: Payer: Self-pay | Admitting: Family Medicine

## 2011-01-06 ENCOUNTER — Other Ambulatory Visit: Payer: Self-pay | Admitting: Family Medicine

## 2011-01-06 DIAGNOSIS — Z1231 Encounter for screening mammogram for malignant neoplasm of breast: Secondary | ICD-10-CM

## 2011-01-25 ENCOUNTER — Ambulatory Visit
Admission: RE | Admit: 2011-01-25 | Discharge: 2011-01-25 | Disposition: A | Discharge status: Home / Self Care | Payer: Medicare Other | Source: Ambulatory Visit | Attending: Family Medicine | Admitting: Family Medicine

## 2011-01-25 DIAGNOSIS — Z1231 Encounter for screening mammogram for malignant neoplasm of breast: Secondary | ICD-10-CM

## 2011-01-27 ENCOUNTER — Encounter: Payer: Self-pay | Admitting: Family Medicine

## 2011-02-06 IMAGING — CR DG FOOT COMPLETE 3+V*L*
3 series · 3 of 3 positions shown · non-contrast
Comparison: None.

CLINICAL DATA: Left foot pain.  No history of trauma.

LEFT FOOT - COMPLETE 3+ VIEW

[view not recorded (1 of 3)]
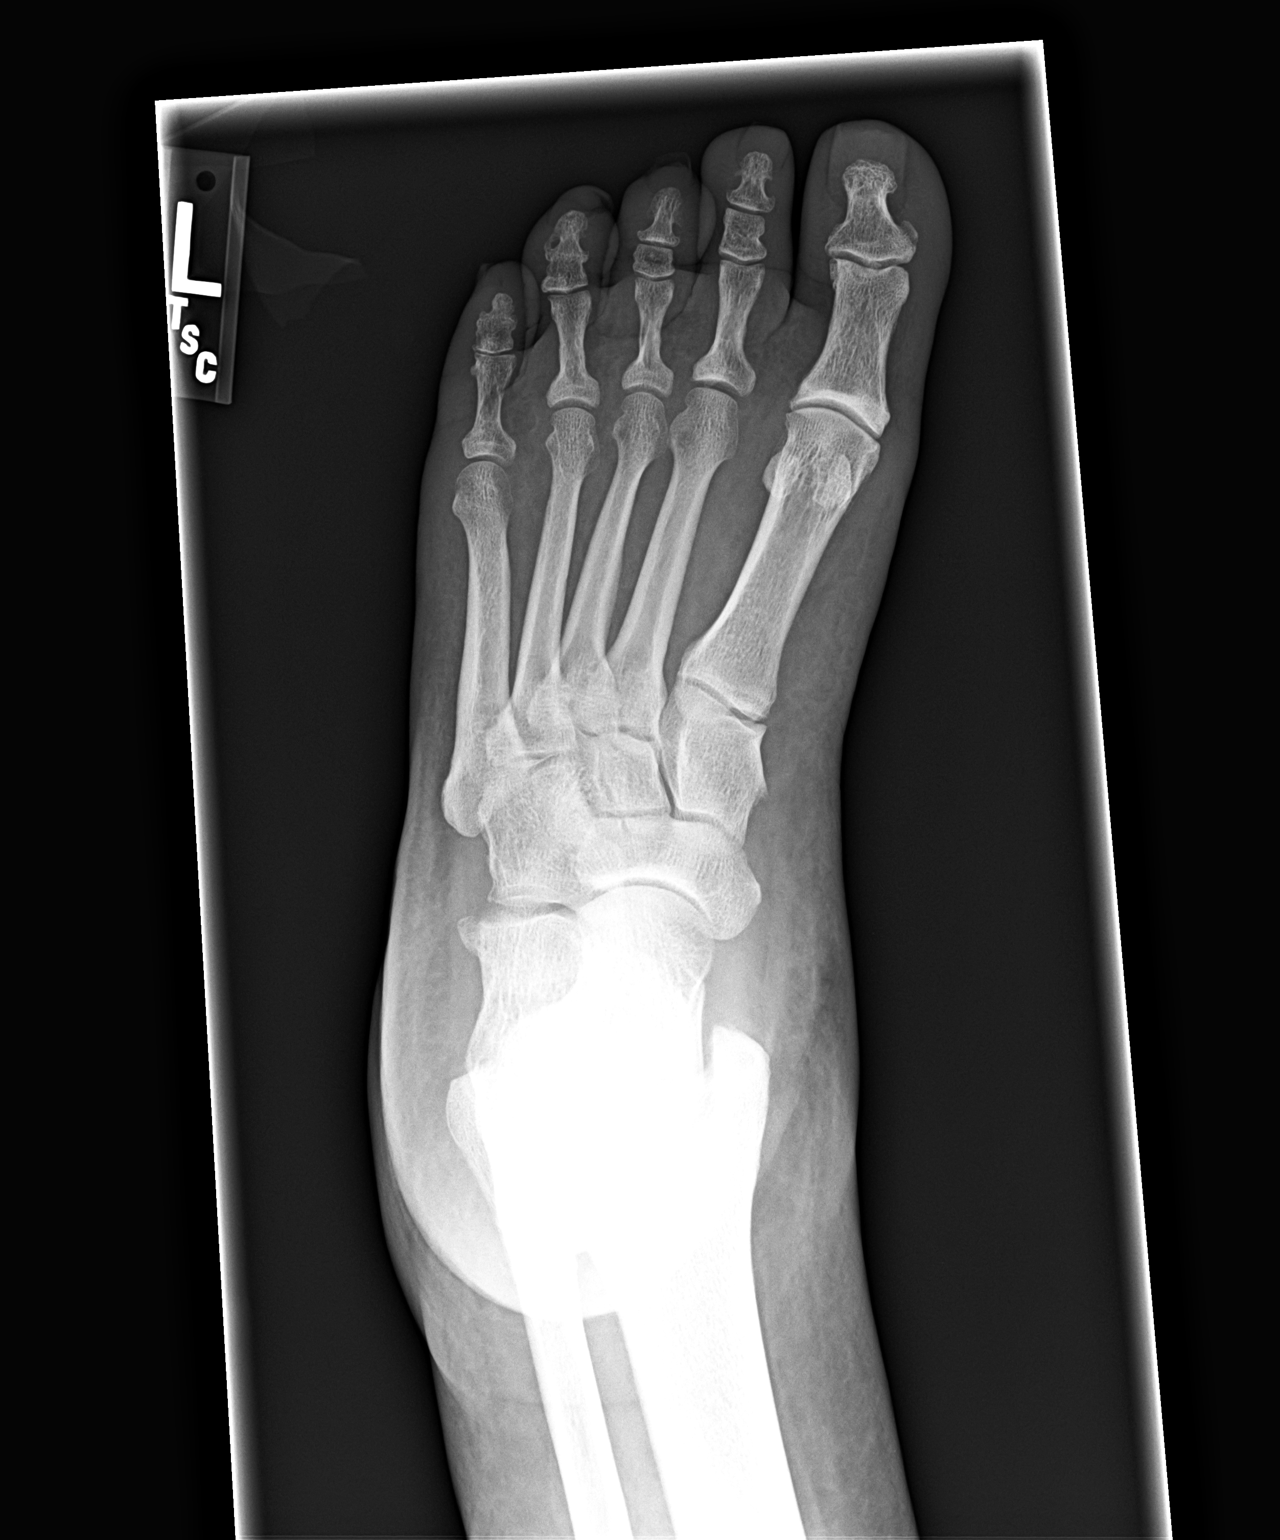

[view not recorded (2 of 3)]
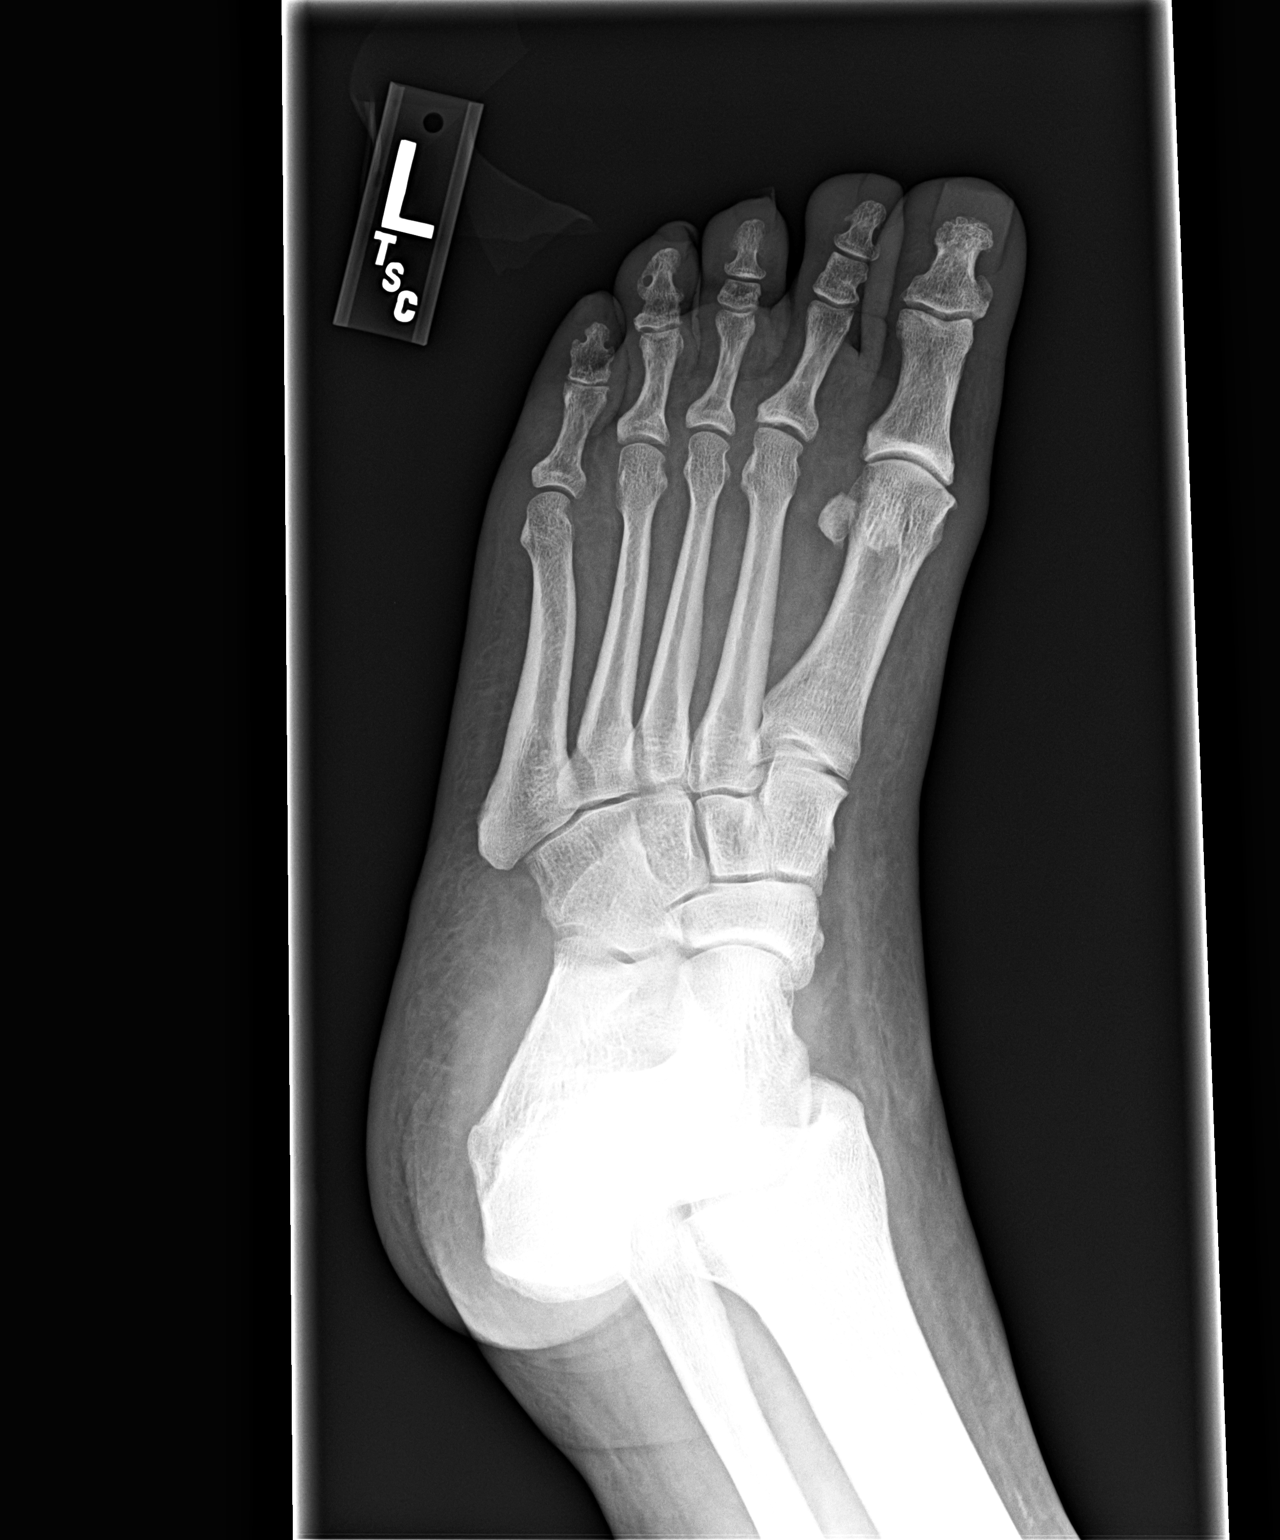

[view not recorded (3 of 3)]
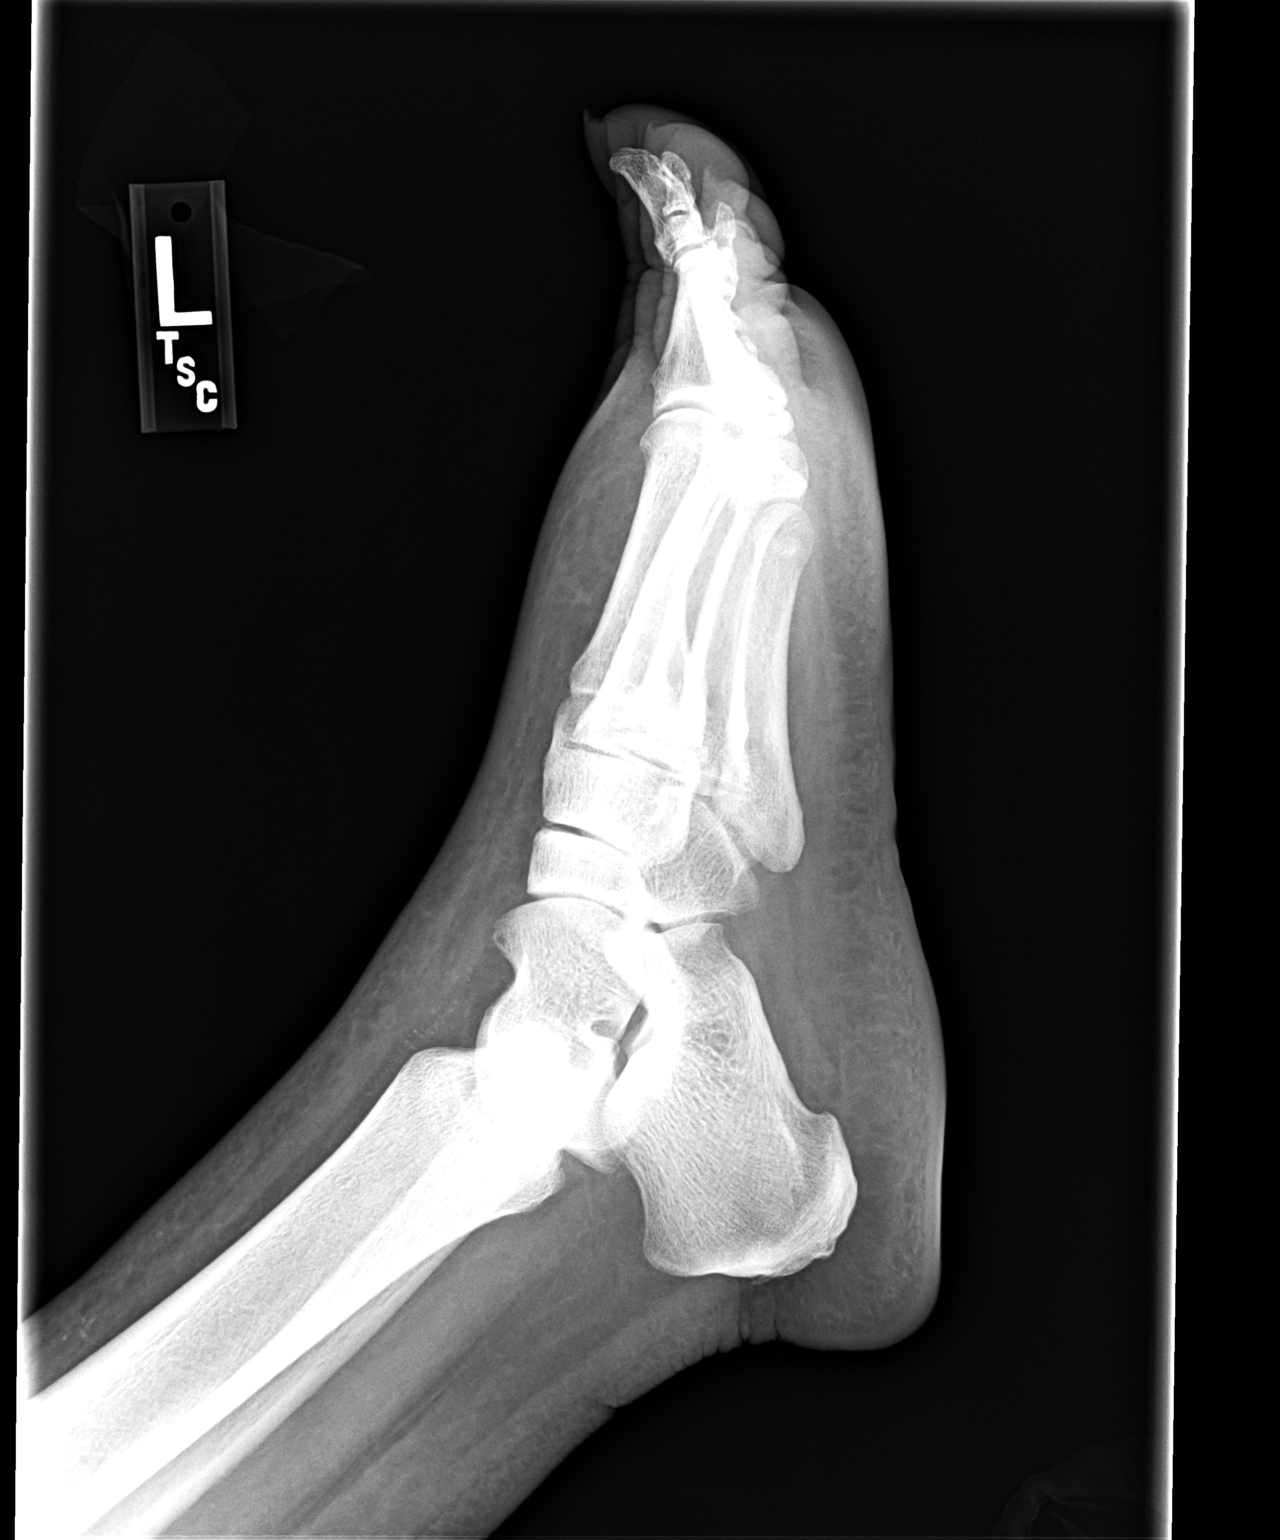

[3 of 3 positions shown; findings below may reference images not displayed]

FINDINGS: There are no erosive or destructive changes.  There is no
evidence for occult fracture.  Small vessel arterial calcification
is noted.
IMPRESSION: Small vessel arterial calcification noted.  No acute findings.

## 2011-02-21 ENCOUNTER — Other Ambulatory Visit: Payer: Self-pay | Admitting: Family Medicine

## 2011-02-21 NOTE — Telephone Encounter (Signed)
To PCP

## 2011-03-15 ENCOUNTER — Other Ambulatory Visit: Payer: Self-pay | Admitting: Family Medicine

## 2011-05-29 ENCOUNTER — Encounter: Payer: Self-pay | Admitting: Family Medicine

## 2011-05-29 ENCOUNTER — Ambulatory Visit (INDEPENDENT_AMBULATORY_CARE_PROVIDER_SITE_OTHER): Payer: Medicare Other | Admitting: Family Medicine

## 2011-05-29 VITALS — BP 144/72 | HR 75 | Temp 97.5°F | Wt 174.0 lb

## 2011-05-29 DIAGNOSIS — I10 Essential (primary) hypertension: Secondary | ICD-10-CM | POA: Diagnosis not present

## 2011-05-29 DIAGNOSIS — E785 Hyperlipidemia, unspecified: Secondary | ICD-10-CM

## 2011-05-29 DIAGNOSIS — E119 Type 2 diabetes mellitus without complications: Secondary | ICD-10-CM | POA: Diagnosis not present

## 2011-05-29 DIAGNOSIS — N289 Disorder of kidney and ureter, unspecified: Secondary | ICD-10-CM

## 2011-05-29 NOTE — Patient Instructions (Signed)
Come back for fasting labs.   Get some preparation H and see if that helps with the hemorrhoids.  If not, let me know.   Schedule a physical for this summer.   Take care.  Glad to see you.  If the knee pain gets worse, let me know.

## 2011-05-29 NOTE — Progress Notes (Signed)
Diabetes:  No meds Hypoglycemic episodes:no Hyperglycemic episodes:no Feet problems:no, other than edema Blood Sugars averaging:  eye exam within last year: due, she'll call about f/u.   Hypertension:    Using medication without problems: yes Chest pain with exertion:no Edema: at baseline Short of breath:no Average home BPs: not checked  Elevated Cholesterol: Using medications without problems:yes Muscle aches: no Diet compliance: "sometimes I do good and sometimes I don't" Exercise: limited by knee pain  Knee pain. Some better with topical creams.  She'll let me know if it gets worse.    PMH and SH reviewed.   Vital signs, Meds and allergies reviewed.  ROS: See HPI.  Otherwise nontributory.   GEN: nad, alert and oriented HEENT: mucous membranes moist NECK: supple w/o LA CV: rrr PULM: ctab, no inc wob ABD: soft, +bs EXT: 2+  Edema, at baseline per patient.   SKIN: no acute rash  Diabetic foot exam: Normal inspection No skin breakdown No calluses  Normal DP pulses Normal sensation to light tough and monofilament Nails normal

## 2011-05-30 NOTE — Assessment & Plan Note (Signed)
Return for labs. D/wpt about weight, diet.

## 2011-05-30 NOTE — Assessment & Plan Note (Addendum)
No meds now. Return for labs. D/w pt about diet and she'll check on eye clinic f/u.

## 2011-05-30 NOTE — Assessment & Plan Note (Signed)
Return for labs.  

## 2011-05-30 NOTE — Assessment & Plan Note (Signed)
Continue as is for now.  Will not adjust meds w/o Cr recheck.  D/w pt about diet and salt.

## 2011-05-31 ENCOUNTER — Other Ambulatory Visit (INDEPENDENT_AMBULATORY_CARE_PROVIDER_SITE_OTHER): Payer: Medicare Other

## 2011-05-31 DIAGNOSIS — E119 Type 2 diabetes mellitus without complications: Secondary | ICD-10-CM | POA: Diagnosis not present

## 2011-05-31 LAB — LIPID PANEL
Cholesterol: 126 mg/dL (ref 0–200)
HDL: 58.7 mg/dL (ref >39.00)
LDL Cholesterol: 57 mg/dL (ref 0–99)
VLDL: 10 mg/dL (ref 0.0–40.0)

## 2011-05-31 LAB — COMPREHENSIVE METABOLIC PANEL
AST: 18 U/L (ref 0–37)
Albumin: 3.8 g/dL (ref 3.5–5.2)
Alkaline Phosphatase: 46 U/L (ref 39–117)
BUN: 16 mg/dL (ref 6–23)
Potassium: 4.2 mEq/L (ref 3.5–5.1)
Total Bilirubin: 1 mg/dL (ref 0.3–1.2)

## 2011-06-01 ENCOUNTER — Other Ambulatory Visit: Payer: Self-pay | Admitting: *Deleted

## 2011-06-01 MED ORDER — ROSUVASTATIN CALCIUM 5 MG PO TABS: 5.0000 mg | ORAL_TABLET | Freq: Every day | ORAL | Status: DC

## 2011-06-01 NOTE — Telephone Encounter (Signed)
Sent!

## 2011-06-01 NOTE — Telephone Encounter (Signed)
Ok to fill? Never ordered by this office

## 2011-08-17 ENCOUNTER — Other Ambulatory Visit: Payer: Self-pay | Admitting: Family Medicine

## 2011-09-04 ENCOUNTER — Other Ambulatory Visit: Payer: Self-pay | Admitting: Family Medicine

## 2011-09-27 ENCOUNTER — Other Ambulatory Visit: Payer: Self-pay | Admitting: Family Medicine

## 2011-11-06 ENCOUNTER — Other Ambulatory Visit: Payer: Self-pay | Admitting: Family Medicine

## 2011-11-06 NOTE — Telephone Encounter (Signed)
Does patient need to schedule CPE or labs?  Please advise.

## 2011-11-06 NOTE — Telephone Encounter (Signed)
Needs OV with labs ahead of time.  rx sent.

## 2011-11-07 NOTE — Telephone Encounter (Signed)
Patient advised.  Appts for lab and F/U scheduled.

## 2011-11-07 NOTE — Telephone Encounter (Signed)
No answer, no A/M.  Will try again later. 

## 2011-11-17 ENCOUNTER — Other Ambulatory Visit: Payer: Self-pay | Admitting: Family Medicine

## 2011-11-17 DIAGNOSIS — E119 Type 2 diabetes mellitus without complications: Secondary | ICD-10-CM

## 2011-11-17 DIAGNOSIS — E559 Vitamin D deficiency, unspecified: Secondary | ICD-10-CM

## 2011-11-21 ENCOUNTER — Other Ambulatory Visit: Payer: Self-pay | Admitting: Family Medicine

## 2011-11-21 ENCOUNTER — Other Ambulatory Visit (INDEPENDENT_AMBULATORY_CARE_PROVIDER_SITE_OTHER): Payer: Medicare Other

## 2011-11-21 DIAGNOSIS — E559 Vitamin D deficiency, unspecified: Secondary | ICD-10-CM | POA: Diagnosis not present

## 2011-11-21 DIAGNOSIS — E119 Type 2 diabetes mellitus without complications: Secondary | ICD-10-CM | POA: Diagnosis not present

## 2011-11-21 LAB — BASIC METABOLIC PANEL
CO2: 24 mEq/L (ref 19–32)
Calcium: 9.2 mg/dL (ref 8.4–10.5)
Chloride: 111 mEq/L (ref 96–112)
Glucose, Bld: 91 mg/dL (ref 70–99)
Potassium: 4.3 mEq/L (ref 3.5–5.1)
Sodium: 144 mEq/L (ref 135–145)

## 2011-11-22 LAB — VITAMIN D 25 HYDROXY (VIT D DEFICIENCY, FRACTURES): Vit D, 25-Hydroxy: 26 ng/mL — ABNORMAL LOW (ref 30–89)

## 2011-11-24 ENCOUNTER — Encounter: Payer: Self-pay | Admitting: Family Medicine

## 2011-11-24 ENCOUNTER — Ambulatory Visit (INDEPENDENT_AMBULATORY_CARE_PROVIDER_SITE_OTHER): Payer: Medicare Other | Admitting: Family Medicine

## 2011-11-24 VITALS — BP 116/70 | HR 64 | Temp 98.3°F | Wt 174.0 lb

## 2011-11-24 DIAGNOSIS — I10 Essential (primary) hypertension: Secondary | ICD-10-CM | POA: Diagnosis not present

## 2011-11-24 DIAGNOSIS — E785 Hyperlipidemia, unspecified: Secondary | ICD-10-CM

## 2011-11-24 DIAGNOSIS — E119 Type 2 diabetes mellitus without complications: Secondary | ICD-10-CM

## 2011-11-24 DIAGNOSIS — M25519 Pain in unspecified shoulder: Secondary | ICD-10-CM | POA: Diagnosis not present

## 2011-11-24 NOTE — Patient Instructions (Addendum)
I would get a flu shot each fall.   Check with your insurance to see if they will cover the shingles shot. Take 1000 units of vitamin D a day.   Recheck labs before a physical next year (Jan or Feb of 2014). Use the shoulder exercises.

## 2011-11-24 NOTE — Progress Notes (Signed)
Tolerates lower back and knee pain with aspercreme use.  R>L shoulder pain, worse with overhead movements.  No trauma.    Diabetes:  No meds.   Hypoglycemic episodes: no sx Hyperglycemic episodes:no sx Feet problems:no Blood Sugars averaging: not checked eye exam within last year: due, she has f/u pending.   Hypertension:    Using medication without problems or lightheadedness: yes Chest pain with exertion:no Edema: some in ankles worse as the day goes on Short of breath:no  Vit D was low.  Discussed.    PMH and SH reviewed.   Vital signs, Meds and allergies reviewed.  ROS: See HPI.  Otherwise nontributory.   GEN: nad, alert and oriented HEENT: mucous membranes moist NECK: supple w/o LA CV: rrr PULM: ctab, no inc wob ABD: soft, +bs EXT: no edema SKIN: no acute rash R shoulder with pain on int/ext rotation. +supraspinatus testing.  Mildly limited overhead ROM.  No distal weakness.   Diabetic foot exam: Normal inspection No skin breakdown No calluses  Normal DP pulses Normal sensation to light tough and monofilament Nails normal

## 2011-11-27 DIAGNOSIS — M25519 Pain in unspecified shoulder: Secondary | ICD-10-CM | POA: Insufficient documentation

## 2011-11-27 NOTE — Assessment & Plan Note (Signed)
Continue to follow, continue work on diet and weight.   Labs d/w pt. Continue meds.   

## 2011-11-27 NOTE — Assessment & Plan Note (Signed)
Continue to follow, continue work on diet and weight.   Labs d/w pt. Continue meds.

## 2011-11-27 NOTE — Assessment & Plan Note (Signed)
Likely cuff source.  D/w pt about home exercise and anatomy.  She agrees. Will follow up prn.

## 2011-11-27 NOTE — Assessment & Plan Note (Signed)
No meds, controlled, continue to follow, continue work on diet and weight.   Labs d/w pt.

## 2011-12-25 ENCOUNTER — Ambulatory Visit (INDEPENDENT_AMBULATORY_CARE_PROVIDER_SITE_OTHER): Payer: Medicare Other | Admitting: Family Medicine

## 2011-12-25 ENCOUNTER — Other Ambulatory Visit: Payer: Self-pay | Admitting: Family Medicine

## 2011-12-25 ENCOUNTER — Encounter: Payer: Self-pay | Admitting: Family Medicine

## 2011-12-25 VITALS — BP 122/68 | HR 55 | Temp 98.3°F | Wt 176.8 lb

## 2011-12-25 DIAGNOSIS — R413 Other amnesia: Secondary | ICD-10-CM

## 2011-12-25 LAB — COMPREHENSIVE METABOLIC PANEL
Alkaline Phosphatase: 47 U/L (ref 39–117)
BUN: 17 mg/dL (ref 6–23)
CO2: 27 mEq/L (ref 19–32)
GFR: 59.74 mL/min — ABNORMAL LOW (ref >60.00)
Glucose, Bld: 90 mg/dL (ref 70–99)
Total Bilirubin: 1 mg/dL (ref 0.3–1.2)
Total Protein: 7.6 g/dL (ref 6.0–8.3)

## 2011-12-25 LAB — CBC WITH DIFFERENTIAL/PLATELET
Basophils Relative: 0.5 % (ref 0.0–3.0)
Eosinophils Relative: 2.4 % (ref 0.0–5.0)
HCT: 38.1 % (ref 36.0–46.0)
Hemoglobin: 12.5 g/dL (ref 12.0–15.0)
Lymphs Abs: 1.7 10*3/uL (ref 0.7–4.0)
MCV: 85 fl (ref 78.0–100.0)
Monocytes Absolute: 0.6 10*3/uL (ref 0.1–1.0)
Monocytes Relative: 12.1 % — ABNORMAL HIGH (ref 3.0–12.0)
Neutro Abs: 2.6 10*3/uL (ref 1.4–7.7)
RBC: 4.48 Mil/uL (ref 3.87–5.11)
WBC: 5.1 10*3/uL (ref 4.5–10.5)

## 2011-12-25 LAB — VITAMIN B12: Vitamin B-12: 385 pg/mL (ref 211–911)

## 2011-12-25 LAB — FOLATE: Folate: 17.9 ng/mL (ref >5.9)

## 2011-12-25 NOTE — Telephone Encounter (Signed)
Sent!

## 2011-12-25 NOTE — Patient Instructions (Addendum)
Go to the lab on the way out.  We'll contact you with your lab report. Take care.  Don't drive in the meantime.

## 2011-12-25 NOTE — Telephone Encounter (Signed)
Patient is coming in for appt this afternoon so I am waiting to be sure that medication is not changed before sending in refills.  Please advise.

## 2011-12-25 NOTE — Progress Notes (Signed)
Here today with her nephew.  Hx per pt and nephew.  She admits to visual hallucinations.  Family had noted change in behavior in last few weeks.  She didn't think her thoughts were always correct.  She was thinking that her dead husband was alive and back at home.  She woke her nephew one morning, about 3AM, and told him the floor was wet (per nephew it was dry).  She's been up at night, some but not every night.  Has seen people cooking at home. Has seen dead relatives with dogs.  Some nights she does well w/o sx.  Sx more likely to happen at night, but can occ happen during the day.    Her meds had been accurate at home.  FH AD.  No h/o CVA in patient.    PMH and SH reviewed  ROS: See HPI, otherwise noncontributory.  Meds, vitals, and allergies reviewed.   GEN: nad, alert and oriented HEENT: mucous membranes moist NECK: supple w/o LA CV: rrr PULM: ctab, no inc wob ABD: soft, +bs EXT: no edema SKIN: no acute rash CN 2-12 wnl B, S/S/DTR wnl x4 MMSE 24/30, -2 recall, -3 attention, -1 copying

## 2011-12-25 NOTE — Telephone Encounter (Signed)
And I notified the family today. FYI.  Nothing to do now.

## 2011-12-25 NOTE — Assessment & Plan Note (Addendum)
With some hallucinations.  NAD today and oriented today.  Sx wax and wane.  Broad ddx d/w pt and nephew.  Check basic labs today, if unremarkable, then check head CT.  AD is possible.  No driving in meantime. This doesn't appear to be med related.  >25 min spent with face to face with patient, >50% counseling and/or coordinating care 12/2011 MMSE 24/30, -2 recall, -3 attention, -1 copying  Addendum- labs unremarkable. Proceed with head CT.

## 2011-12-26 LAB — RPR

## 2011-12-26 LAB — URINE CULTURE: Colony Count: NO GROWTH

## 2011-12-26 NOTE — Addendum Note (Signed)
Addended by: Joaquim Nam on: 12/26/2011 11:37 PM   Modules accepted: Orders

## 2011-12-28 ENCOUNTER — Ambulatory Visit (INDEPENDENT_AMBULATORY_CARE_PROVIDER_SITE_OTHER)
Admission: RE | Admit: 2011-12-28 | Discharge: 2011-12-28 | Disposition: A | Discharge status: Home / Self Care | Payer: Medicare Other | Source: Ambulatory Visit | Attending: Family Medicine | Admitting: Family Medicine

## 2011-12-28 DIAGNOSIS — H532 Diplopia: Secondary | ICD-10-CM | POA: Diagnosis not present

## 2011-12-28 DIAGNOSIS — R413 Other amnesia: Secondary | ICD-10-CM

## 2012-01-01 ENCOUNTER — Telehealth: Payer: Self-pay | Admitting: *Deleted

## 2012-01-01 NOTE — Telephone Encounter (Signed)
Patient's nephew Casimiro Needle) is calling for the results of her CT of the head.

## 2012-01-01 NOTE — Telephone Encounter (Signed)
See notes on CT report; please call them about this.

## 2012-01-01 NOTE — Telephone Encounter (Signed)
Spoke with family member.  Appt scheduled at his request to speak with the MD.

## 2012-01-02 ENCOUNTER — Ambulatory Visit (INDEPENDENT_AMBULATORY_CARE_PROVIDER_SITE_OTHER): Payer: Medicare Other | Admitting: Family Medicine

## 2012-01-02 ENCOUNTER — Telehealth: Payer: Self-pay

## 2012-01-02 ENCOUNTER — Encounter: Payer: Self-pay | Admitting: Family Medicine

## 2012-01-02 VITALS — BP 152/86 | HR 68 | Temp 97.6°F | Wt 176.0 lb

## 2012-01-02 DIAGNOSIS — R443 Hallucinations, unspecified: Secondary | ICD-10-CM | POA: Diagnosis not present

## 2012-01-02 MED ORDER — RISPERIDONE 0.25 MG PO TABS: 0.2500 mg | ORAL_TABLET | Freq: Two times a day (BID) | ORAL | Status: DC

## 2012-01-02 NOTE — Telephone Encounter (Signed)
Please get on schedule for today.  Thanks.  Marland Kitchen

## 2012-01-02 NOTE — Telephone Encounter (Signed)
Pt scheduled to see Dr Para March today at 3:45 pm; pt will arrive at 3:30 per Dr Para March. Evangaline advised if pts condition changes or worsens to call back. Evangaline voiced understanding.

## 2012-01-02 NOTE — Patient Instructions (Addendum)
Take risperdal 0.25mg  twice a day and give me an update in a few days, sooner if needed.  Take care.

## 2012-01-02 NOTE — Telephone Encounter (Signed)
Evangaline calls pt having hallucinations; pt sees people with no legs and "they are trying to get her" Now pt is dressed in her living room going to husband's funeral.(pt husband died 10/07/08.). Evangaline said pt has hx of a breakdown. Pt has been seeing people for 2 weeks. Pt thinks "the people are trying to kill her".Evangaline no sure how to proceed.Please advise.

## 2012-01-03 ENCOUNTER — Encounter: Payer: Self-pay | Admitting: Family Medicine

## 2012-01-03 DIAGNOSIS — R443 Hallucinations, unspecified: Secondary | ICD-10-CM | POA: Insufficient documentation

## 2012-01-03 NOTE — Progress Notes (Signed)
Long visit with pt and 5 family members today.  Memory changes noted this summer along with hallucinations.  Hallucinations are more troublesome than the memory changes.  Prev with 24/30 on MMSE.  Inc in hallucinations recently.  Most center on her dead husband.  Intermittent sx.  Usually worse at night.  Prev eval unremarkable for reversible cause.  All d/w pt and family.    Additional hx- pt had episode of change in thoughts about 30-40 years ago.  This was allegedly around the time of a possible affair by her husband.  He has since died. Noted that most hallucinations center on her husband and other women.    No tremor.  No other new systemic sx.   Meds, vitals, and allergies reviewed.   ROS: See HPI.  Otherwise, noncontributory.  She is alert and pleasant.  She understands her situation and by her, my, and family's estimation she is able to make informed choices.

## 2012-01-03 NOTE — Assessment & Plan Note (Signed)
This could a constellation of psychotic events but the possibility of lewy body dementia is noted.  LBD d/w pt and family.  Unclear cause overall (patient is religious and believes this could be from a demon).  D/w pt about options.  It would be reasonable to treat the hallucinations.  Given the bothersome nature and relatively mild memory changes, would use risperdal instead of aricept.  If not improved, then consider neuro referral.  It was offered today.  I have reviewed the old chart, hard copy about prev event 30-40 years ago. No mention noted in chart.  They'll call back with update.  We may need to titrate risperdal up.  >45 min spent with face to face with patient, >50% counseling and/or coordinating care.

## 2012-01-04 ENCOUNTER — Ambulatory Visit: Payer: Medicare Other | Admitting: Family Medicine

## 2012-01-09 ENCOUNTER — Telehealth: Payer: Self-pay | Admitting: Family Medicine

## 2012-01-09 ENCOUNTER — Ambulatory Visit (INDEPENDENT_AMBULATORY_CARE_PROVIDER_SITE_OTHER): Payer: Medicare Other | Admitting: Family Medicine

## 2012-01-09 ENCOUNTER — Encounter: Payer: Self-pay | Admitting: Family Medicine

## 2012-01-09 VITALS — BP 126/70 | HR 68 | Temp 98.5°F | Ht 60.25 in | Wt 179.0 lb

## 2012-01-09 DIAGNOSIS — R443 Hallucinations, unspecified: Secondary | ICD-10-CM | POA: Diagnosis not present

## 2012-01-09 NOTE — Telephone Encounter (Signed)
Noted  

## 2012-01-09 NOTE — Patient Instructions (Addendum)
See Shirlee Limerick about your referral before you leave today. Stay off the risperdal for now.

## 2012-01-09 NOTE — Telephone Encounter (Signed)
Triage Record Num: [id number] Operator: Tarri Glenn Patient Name: Kristen Goodman Call Date & Time: 01/07/2012 5:29:37PM Patient Phone: [phone] PCP: Crawford Givens Patient Gender: Female PCP Fax : Patient DOB: 03/14/1935 Practice Name[facility] Reason for Call: Caller: Vangie/Other; PCP: Crawford Givens Clelia Croft) [facility]); CB#: [phone]; Daughter/Vangie is calling about patient was seen in office 01/02/12 for hallucinations, was given Respiradone and hallucinations have gotten worse. Patient now does not feel safe in own house, thinks there is someone in home that will get her. All emergent signs and symptoms ruled out with exception to hearing voices that no one else hears or seeing things that no one else sees per Confusion, Disorientation, Agitation protocol. Advised daughter to take patient to emergency room per see ED immediately disposition. Daughter talked of going to [facility]. Daughter also requested appointment for 01/09/12. Appointment given for 01/09/12 @ 0815 per daughter's request. Protocol(s) Used: Confusion, Disorientation, Agitation Recommended Outcome per Protocol: See ED Immediately Reason for Outcome: Hearing voices that no one else hears or seeing things that no one else sees Care Advice: ~ 01/07/2012 5:49:00PM Page 1 of 1 [id number]

## 2012-01-10 NOTE — Progress Notes (Signed)
Hallucinations continue.  She may have had some benefit from initial doses of risperdal, but after a few days she had more trouble with sleep disruption, up at night, and more hallucinations.  Family here with her today.  No other acute change, ie gait change, tremor, fever, etc.  We talked about options.  Detailed discussion with family and pt.   No other new meds.    Meds, vitals, and allergies reviewed.   ROS: See HPI.  Otherwise, noncontributory.  nad Pleasant in conversation but worried about 'having to go some place' (ie inpatient treatment) Speech is normal rrr ctab No tremor Gait at baseline

## 2012-01-10 NOTE — Assessment & Plan Note (Signed)
>  25 min spent with face to face with patient, >50% counseling and/or coordinating care.  Refer to psych.  This could be Lewy body, but I want psych input.  It may be that she was going to have wax/waning of sx in meantime in spite of the risperdal, or the med itself could have exacerbated her sx.  In any event, no SI/HI and I would like psych input.  Pt agrees to referral.  Family agrees.

## 2012-01-11 DIAGNOSIS — F329 Major depressive disorder, single episode, unspecified: Secondary | ICD-10-CM | POA: Diagnosis not present

## 2012-01-23 DIAGNOSIS — F329 Major depressive disorder, single episode, unspecified: Secondary | ICD-10-CM | POA: Diagnosis not present

## 2012-01-31 ENCOUNTER — Other Ambulatory Visit: Payer: Self-pay | Admitting: Family Medicine

## 2012-01-31 DIAGNOSIS — Z1231 Encounter for screening mammogram for malignant neoplasm of breast: Secondary | ICD-10-CM

## 2012-02-06 DIAGNOSIS — F329 Major depressive disorder, single episode, unspecified: Secondary | ICD-10-CM | POA: Diagnosis not present

## 2012-02-14 ENCOUNTER — Ambulatory Visit
Admission: RE | Admit: 2012-02-14 | Discharge: 2012-02-14 | Disposition: A | Discharge status: Home / Self Care | Payer: Medicare Other | Source: Ambulatory Visit | Attending: Family Medicine | Admitting: Family Medicine

## 2012-02-14 DIAGNOSIS — Z1231 Encounter for screening mammogram for malignant neoplasm of breast: Secondary | ICD-10-CM

## 2012-02-15 ENCOUNTER — Encounter: Payer: Self-pay | Admitting: *Deleted

## 2012-02-20 DIAGNOSIS — F329 Major depressive disorder, single episode, unspecified: Secondary | ICD-10-CM | POA: Diagnosis not present

## 2012-03-05 DIAGNOSIS — F329 Major depressive disorder, single episode, unspecified: Secondary | ICD-10-CM | POA: Diagnosis not present

## 2012-03-19 DIAGNOSIS — F329 Major depressive disorder, single episode, unspecified: Secondary | ICD-10-CM | POA: Diagnosis not present

## 2012-03-22 ENCOUNTER — Other Ambulatory Visit: Payer: Self-pay | Admitting: Family Medicine

## 2012-03-23 ENCOUNTER — Other Ambulatory Visit: Payer: Self-pay | Admitting: Family Medicine

## 2012-03-25 NOTE — Telephone Encounter (Signed)
Was stopped prev, they were to f/u with psych.

## 2012-03-25 NOTE — Telephone Encounter (Signed)
Electronic refill request. This medication is not on the patient's current meds list.  Please advise.  

## 2012-04-01 ENCOUNTER — Other Ambulatory Visit: Payer: Self-pay | Admitting: Family Medicine

## 2012-04-02 ENCOUNTER — Other Ambulatory Visit: Payer: Self-pay | Admitting: *Deleted

## 2012-04-02 DIAGNOSIS — F329 Major depressive disorder, single episode, unspecified: Secondary | ICD-10-CM | POA: Diagnosis not present

## 2012-04-02 MED ORDER — METOPROLOL TARTRATE 100 MG PO TABS: 100.0000 mg | ORAL_TABLET | Freq: Two times a day (BID) | ORAL | Status: DC

## 2012-04-09 ENCOUNTER — Telehealth: Payer: Self-pay

## 2012-04-09 DIAGNOSIS — F329 Major depressive disorder, single episode, unspecified: Secondary | ICD-10-CM | POA: Diagnosis not present

## 2012-04-09 NOTE — Telephone Encounter (Signed)
Pt left note at front window requesting permission to drive again. Pt still has her driver's license and wants to drive. Pt already scheduled for CPX on 05/31/12.Please advise.

## 2012-04-10 NOTE — Telephone Encounter (Signed)
Patient notified as instructed by telephone. Was advised that she has an appointment with psych Tuesday and will have her forward notes from that visit to Dr. Para March.

## 2012-04-10 NOTE — Telephone Encounter (Signed)
Denied until I have evidence that she is fit to drive.  We had referred her to psych at last OV due to hallucinations and I don't have any follow up information.

## 2012-04-16 DIAGNOSIS — F329 Major depressive disorder, single episode, unspecified: Secondary | ICD-10-CM | POA: Diagnosis not present

## 2012-05-03 DIAGNOSIS — F329 Major depressive disorder, single episode, unspecified: Secondary | ICD-10-CM | POA: Diagnosis not present

## 2012-05-17 DIAGNOSIS — F329 Major depressive disorder, single episode, unspecified: Secondary | ICD-10-CM | POA: Diagnosis not present

## 2012-05-21 ENCOUNTER — Other Ambulatory Visit: Payer: Self-pay | Admitting: Family Medicine

## 2012-05-21 DIAGNOSIS — E559 Vitamin D deficiency, unspecified: Secondary | ICD-10-CM

## 2012-05-21 DIAGNOSIS — E119 Type 2 diabetes mellitus without complications: Secondary | ICD-10-CM

## 2012-05-27 ENCOUNTER — Other Ambulatory Visit (INDEPENDENT_AMBULATORY_CARE_PROVIDER_SITE_OTHER): Payer: Medicare Other

## 2012-05-27 DIAGNOSIS — E559 Vitamin D deficiency, unspecified: Secondary | ICD-10-CM | POA: Diagnosis not present

## 2012-05-27 DIAGNOSIS — E119 Type 2 diabetes mellitus without complications: Secondary | ICD-10-CM

## 2012-05-27 LAB — COMPREHENSIVE METABOLIC PANEL
AST: 19 U/L (ref 0–37)
Alkaline Phosphatase: 48 U/L (ref 39–117)
Glucose, Bld: 103 mg/dL — ABNORMAL HIGH (ref 70–99)
Potassium: 3.3 mEq/L — ABNORMAL LOW (ref 3.5–5.1)
Sodium: 140 mEq/L (ref 135–145)
Total Bilirubin: 1.1 mg/dL (ref 0.3–1.2)
Total Protein: 7.4 g/dL (ref 6.0–8.3)

## 2012-05-27 LAB — LIPID PANEL
HDL: 55 mg/dL (ref >39.00)
LDL Cholesterol: 45 mg/dL (ref 0–99)
Total CHOL/HDL Ratio: 2
VLDL: 13.8 mg/dL (ref 0.0–40.0)

## 2012-05-27 LAB — HEMOGLOBIN A1C: Hgb A1c MFr Bld: 5.6 % (ref 4.6–6.5)

## 2012-05-30 DIAGNOSIS — F329 Major depressive disorder, single episode, unspecified: Secondary | ICD-10-CM | POA: Diagnosis not present

## 2012-05-31 ENCOUNTER — Encounter: Payer: Self-pay | Admitting: Family Medicine

## 2012-05-31 ENCOUNTER — Other Ambulatory Visit: Payer: Medicare Other

## 2012-05-31 ENCOUNTER — Ambulatory Visit (INDEPENDENT_AMBULATORY_CARE_PROVIDER_SITE_OTHER): Payer: Medicare Other | Admitting: Family Medicine

## 2012-05-31 VITALS — BP 126/76 | HR 68 | Temp 98.2°F | Ht 62.0 in | Wt 183.0 lb

## 2012-05-31 DIAGNOSIS — E785 Hyperlipidemia, unspecified: Secondary | ICD-10-CM | POA: Diagnosis not present

## 2012-05-31 DIAGNOSIS — E559 Vitamin D deficiency, unspecified: Secondary | ICD-10-CM | POA: Diagnosis not present

## 2012-05-31 DIAGNOSIS — R739 Hyperglycemia, unspecified: Secondary | ICD-10-CM

## 2012-05-31 DIAGNOSIS — R443 Hallucinations, unspecified: Secondary | ICD-10-CM | POA: Diagnosis not present

## 2012-05-31 DIAGNOSIS — Z1211 Encounter for screening for malignant neoplasm of colon: Secondary | ICD-10-CM

## 2012-05-31 DIAGNOSIS — I10 Essential (primary) hypertension: Secondary | ICD-10-CM | POA: Diagnosis not present

## 2012-05-31 DIAGNOSIS — R7309 Other abnormal glucose: Secondary | ICD-10-CM

## 2012-05-31 DIAGNOSIS — Z0000 Encounter for general adult medical examination without abnormal findings: Secondary | ICD-10-CM | POA: Diagnosis not present

## 2012-05-31 MED ORDER — CHOLECALCIFEROL 25 MCG (1000 UT) PO CAPS: 1000.0000 [IU] | ORAL_CAPSULE | Freq: Every day | ORAL | Status: DC

## 2012-05-31 NOTE — Patient Instructions (Addendum)
Go to the lab on the way out.  We'll contact you with your lab report.  I would take vitamin D 1000 units/tab, 1 tab a day.  Don't change your other meds.   Cut back on cookies.  When you are getting your mammogram set up for 10/14, notify the clinic and we can help get your bone density test done at the same time.   Check with your insurance to see if they will cover the shingles shot.

## 2012-06-02 DIAGNOSIS — Z0000 Encounter for general adult medical examination without abnormal findings: Secondary | ICD-10-CM | POA: Insufficient documentation

## 2012-06-02 NOTE — Assessment & Plan Note (Signed)
She'll start with otc replacement.

## 2012-06-02 NOTE — Assessment & Plan Note (Signed)
Controlled, continue current meds.   

## 2012-06-02 NOTE — Assessment & Plan Note (Signed)
She'll cut back more on sweets. Labs d/w pt.  She agrees.

## 2012-06-02 NOTE — Assessment & Plan Note (Signed)
See scanned forms.  Routine anticipatory guidance given to patient.  See health maintenance. Flu d/w pt for 2014 Shingles d/w pt PNA 2005 Tetanus 2010 She'll consider DXA for later in 2014. D/w patient [id number] for colon cancer screening, including IFOB vs. colonoscopy.  Risks and benefits of both were discussed and patient voiced understanding.  Pt elects WUJ:WJXB.  Breast cancer screening up to date.  Advance directive d/w pt.  She'll consider getting a living will and will d/w family.   Cognitive function addressed- see scanned forms- and if abnormal then additional documentation follows.

## 2012-06-02 NOTE — Progress Notes (Signed)
I have personally reviewed the Medicare Annual Wellness questionnaire and have noted 1. The patient's medical and social history 2. Their use of alcohol, tobacco or illicit drugs 3. Their current medications and supplements 4. The patient's functional ability including ADL's, fall risks, home safety risks and hearing or visual             impairment. 5. Diet and physical activities 6. Evidence for depression or mood disorders  The patients weight, height, BMI have been recorded in the chart and visual acuity is per eye clinic.  I have made referrals, counseling and provided education to the patient based review of the above and I have provided the pt with a written personalized care plan for preventive services.  See scanned forms.  Routine anticipatory guidance given to patient.  See health maintenance. Flu d/w pt for 2014 Shingles d/w pt PNA 2005 Tetanus 2010 She'll consider DXA for later in 2014. D/w patient [id number] for colon cancer screening, including IFOB vs. colonoscopy.  Risks and benefits of both were discussed and patient voiced understanding.  Pt elects WUJ:WJXB.  Breast cancer screening up to date.  Advance directive d/w pt.  She'll consider getting a living will and will d/w family.   Cognitive function addressed- see scanned forms- and if abnormal then additional documentation follows.   Hypertension:    Using medication without problems or lightheadedness: yes Chest pain with exertion:no Edema: at baseline Short of breath:no  Elevated Cholesterol: Using medications without problems:yes Muscle aches: no Diet compliance:discussed Exercise:limited  H/o hallucinations.  She (and her godson) both state that she is doing well with current meds per psych.  No hallucinations.  Thoughts are clear.  Per godson- 'she's back to the person she was, she's sharp.'  I talked to her about her mood in detail.  "I get down some, but most times I'm good."  No SI/HI.  Contracts for  safety.   Low vit D d/w pt, re: labs and replacement.    PMH and SH reviewed  Meds, vitals, and allergies reviewed.   ROS: See HPI.  Otherwise negative.    GEN: nad, alert and oriented HEENT: mucous membranes moist NECK: supple w/o LA CV: rrr. PULM: ctab, no inc wob ABD: soft, +bs EXT: 1-2 +edema, at baseline.   SKIN: no acute rash

## 2012-06-13 ENCOUNTER — Other Ambulatory Visit: Payer: Self-pay | Admitting: Family Medicine

## 2012-06-13 ENCOUNTER — Other Ambulatory Visit: Payer: Self-pay | Admitting: *Deleted

## 2012-06-13 MED ORDER — ROSUVASTATIN CALCIUM 5 MG PO TABS: 5.0000 mg | ORAL_TABLET | Freq: Every day | ORAL | Status: DC

## 2012-06-17 DIAGNOSIS — F329 Major depressive disorder, single episode, unspecified: Secondary | ICD-10-CM | POA: Diagnosis not present

## 2012-07-01 DIAGNOSIS — F329 Major depressive disorder, single episode, unspecified: Secondary | ICD-10-CM | POA: Diagnosis not present

## 2012-07-22 DIAGNOSIS — F329 Major depressive disorder, single episode, unspecified: Secondary | ICD-10-CM | POA: Diagnosis not present

## 2012-08-01 ENCOUNTER — Other Ambulatory Visit: Payer: Self-pay | Admitting: Family Medicine

## 2012-08-12 ENCOUNTER — Other Ambulatory Visit: Payer: Self-pay | Admitting: Family Medicine

## 2012-08-13 DIAGNOSIS — F329 Major depressive disorder, single episode, unspecified: Secondary | ICD-10-CM | POA: Diagnosis not present

## 2012-09-02 ENCOUNTER — Other Ambulatory Visit: Payer: Self-pay | Admitting: Family Medicine

## 2012-09-03 DIAGNOSIS — F329 Major depressive disorder, single episode, unspecified: Secondary | ICD-10-CM | POA: Diagnosis not present

## 2012-10-14 ENCOUNTER — Ambulatory Visit (INDEPENDENT_AMBULATORY_CARE_PROVIDER_SITE_OTHER): Payer: Medicare Other | Admitting: Family Medicine

## 2012-10-14 ENCOUNTER — Encounter: Payer: Self-pay | Admitting: Family Medicine

## 2012-10-14 VITALS — BP 152/84 | HR 69 | Temp 98.6°F | Wt 183.5 lb

## 2012-10-14 DIAGNOSIS — R112 Nausea with vomiting, unspecified: Secondary | ICD-10-CM | POA: Diagnosis not present

## 2012-10-14 DIAGNOSIS — R443 Hallucinations, unspecified: Secondary | ICD-10-CM

## 2012-10-14 MED ORDER — ONDANSETRON HCL 4 MG PO TABS: 4.0000 mg | ORAL_TABLET | Freq: Three times a day (TID) | ORAL | Status: DC | PRN

## 2012-10-14 NOTE — Assessment & Plan Note (Signed)
Improved per patient report.   

## 2012-10-14 NOTE — Assessment & Plan Note (Signed)
Benign exam, no sig sx currently.  Would hold zofran for prn use, bland diet and sips of fluids.  F/u prn.  She agrees.

## 2012-10-14 NOTE — Patient Instructions (Signed)
I would drink sips of fluids and eat bland food for a few days.   Take the zofran if needed.  Let me know if this doesn't resove.  Take care.

## 2012-10-14 NOTE — Progress Notes (Signed)
Starting last week she was vomiting up clear fluid.  Last vomited this AM.  She can eat, but the vomitus isn't typical food contents. "It's just slimy water."  Vomited 3 times in the last week.  She's been episodically nauseated.  Some food doesn't taste normal, ie the syrup on pancakes, in the last week.  No diarrhea. No fevers. No pain.  Not SOB.  No ST but had a runny nose today.  No blood in vomit or stool.  No ear pain.  Hasn't tried any meds. Drinking coke did help some. No abd pain. She has had recent BMs, usually QOD and this is typical for her.    She isn't having hallucinations.  Compliant with baseline.   Meds, vitals, and allergies reviewed.   ROS: See HPI.  Otherwise, noncontributory.  nad ncat Mmm, tm wnl nasal and OP exam wnl rrr ctab abd soft, not ttp Ext with trace edema

## 2012-11-14 ENCOUNTER — Other Ambulatory Visit: Payer: Self-pay

## 2012-12-18 ENCOUNTER — Other Ambulatory Visit: Payer: Self-pay | Admitting: Family Medicine

## 2012-12-18 ENCOUNTER — Other Ambulatory Visit (INDEPENDENT_AMBULATORY_CARE_PROVIDER_SITE_OTHER): Payer: Medicare Other

## 2012-12-18 DIAGNOSIS — E559 Vitamin D deficiency, unspecified: Secondary | ICD-10-CM

## 2012-12-18 DIAGNOSIS — R739 Hyperglycemia, unspecified: Secondary | ICD-10-CM

## 2012-12-18 DIAGNOSIS — R7309 Other abnormal glucose: Secondary | ICD-10-CM | POA: Diagnosis not present

## 2012-12-19 LAB — VITAMIN D 25 HYDROXY (VIT D DEFICIENCY, FRACTURES): Vit D, 25-Hydroxy: 38 ng/mL (ref 30–89)

## 2012-12-24 ENCOUNTER — Encounter: Payer: Self-pay | Admitting: Family Medicine

## 2012-12-24 ENCOUNTER — Ambulatory Visit (INDEPENDENT_AMBULATORY_CARE_PROVIDER_SITE_OTHER): Payer: Medicare Other | Admitting: Family Medicine

## 2012-12-24 VITALS — BP 140/74 | HR 66 | Temp 98.2°F | Wt 182.5 lb

## 2012-12-24 DIAGNOSIS — R7309 Other abnormal glucose: Secondary | ICD-10-CM | POA: Diagnosis not present

## 2012-12-24 DIAGNOSIS — R739 Hyperglycemia, unspecified: Secondary | ICD-10-CM

## 2012-12-24 DIAGNOSIS — R443 Hallucinations, unspecified: Secondary | ICD-10-CM | POA: Diagnosis not present

## 2012-12-24 DIAGNOSIS — E559 Vitamin D deficiency, unspecified: Secondary | ICD-10-CM | POA: Diagnosis not present

## 2012-12-24 DIAGNOSIS — E669 Obesity, unspecified: Secondary | ICD-10-CM | POA: Diagnosis not present

## 2012-12-24 NOTE — Assessment & Plan Note (Signed)
Controlled with current meds.  Mood is stable.  Has family support.  Continue as is.

## 2012-12-24 NOTE — Assessment & Plan Note (Signed)
A1c fine, elevated glucose was nonfasting on this last draw.

## 2012-12-24 NOTE — Patient Instructions (Addendum)
Don't change your meds.  Take care.  Recheck at a physical in about 6 months, labs ahead of time.  I would get a flu shot each fall.    Glad to see you.

## 2012-12-24 NOTE — Assessment & Plan Note (Signed)
Repleted. °

## 2012-12-24 NOTE — Progress Notes (Addendum)
Vit D repletion done.  Labs d/w pt.  Compliant with meds.    Hyperglycemia.  D/w pt.  A1c not up.  She was not fasting on the last lab draw.   H/o hallucinations.  Controlled with current meds.  Mood is stable.  Has family support.   Meds, vitals, and allergies reviewed.   ROS: See HPI.  Otherwise, noncontributory.  nad ncat Mmm rrr ctab abd soft, not ttp Mentation sharp, speech wnl, conversation wnl No tremor Edema in BLE at baseline

## 2012-12-31 ENCOUNTER — Other Ambulatory Visit: Payer: Self-pay | Admitting: Family Medicine

## 2013-01-21 DIAGNOSIS — IMO0002 Reserved for concepts with insufficient information to code with codable children: Secondary | ICD-10-CM | POA: Diagnosis not present

## 2013-01-27 ENCOUNTER — Other Ambulatory Visit: Payer: Self-pay

## 2013-01-27 DIAGNOSIS — Z1231 Encounter for screening mammogram for malignant neoplasm of breast: Secondary | ICD-10-CM

## 2013-02-02 ENCOUNTER — Other Ambulatory Visit: Payer: Self-pay | Admitting: Family Medicine

## 2013-02-14 ENCOUNTER — Ambulatory Visit
Admission: RE | Admit: 2013-02-14 | Discharge: 2013-02-14 | Disposition: A | Discharge status: Home / Self Care | Payer: Medicare Other | Source: Ambulatory Visit

## 2013-02-14 DIAGNOSIS — Z1231 Encounter for screening mammogram for malignant neoplasm of breast: Secondary | ICD-10-CM | POA: Diagnosis not present

## 2013-02-17 ENCOUNTER — Encounter: Payer: Self-pay | Admitting: *Deleted

## 2013-02-18 DIAGNOSIS — H2510 Age-related nuclear cataract, unspecified eye: Secondary | ICD-10-CM | POA: Diagnosis not present

## 2013-03-24 DIAGNOSIS — H2510 Age-related nuclear cataract, unspecified eye: Secondary | ICD-10-CM | POA: Diagnosis not present

## 2013-04-01 DIAGNOSIS — H2510 Age-related nuclear cataract, unspecified eye: Secondary | ICD-10-CM | POA: Diagnosis not present

## 2013-04-14 DIAGNOSIS — H2589 Other age-related cataract: Secondary | ICD-10-CM | POA: Diagnosis not present

## 2013-04-21 DIAGNOSIS — H2589 Other age-related cataract: Secondary | ICD-10-CM | POA: Diagnosis not present

## 2013-04-21 DIAGNOSIS — H2510 Age-related nuclear cataract, unspecified eye: Secondary | ICD-10-CM | POA: Diagnosis not present

## 2013-05-30 ENCOUNTER — Telehealth: Payer: Self-pay | Admitting: Family Medicine

## 2013-05-30 ENCOUNTER — Ambulatory Visit (INDEPENDENT_AMBULATORY_CARE_PROVIDER_SITE_OTHER): Payer: Medicare Other | Admitting: Family Medicine

## 2013-05-30 ENCOUNTER — Encounter: Payer: Self-pay | Admitting: Family Medicine

## 2013-05-30 VITALS — BP 134/80 | HR 68 | Temp 97.6°F | Wt 177.0 lb

## 2013-05-30 DIAGNOSIS — M545 Low back pain, unspecified: Secondary | ICD-10-CM | POA: Diagnosis not present

## 2013-05-30 DIAGNOSIS — N390 Urinary tract infection, site not specified: Secondary | ICD-10-CM

## 2013-05-30 DIAGNOSIS — Z23 Encounter for immunization: Secondary | ICD-10-CM | POA: Diagnosis not present

## 2013-05-30 LAB — POCT URINALYSIS DIPSTICK
BILIRUBIN UA: NEGATIVE
Glucose, UA: NEGATIVE
Ketones, UA: NEGATIVE
NITRITE UA: NEGATIVE
PH UA: 6
PROTEIN UA: NEGATIVE
Spec Grav, UA: 1.01
UROBILINOGEN UA: 0.2

## 2013-05-30 MED ORDER — CIPROFLOXACIN HCL 250 MG PO TABS: 250.0000 mg | ORAL_TABLET | Freq: Two times a day (BID) | ORAL | Status: DC

## 2013-05-30 NOTE — Addendum Note (Signed)
Addended by: Royann Shivers A on: 05/30/2013 05:08 PM   Modules accepted: Orders

## 2013-05-30 NOTE — Progress Notes (Signed)
   Subjective:    Patient ID: Kristen Goodman, female    DOB: 1935-04-12, 79 y.o.   MRN: [id number]  HPI CC: back pain  Not currently taking abilify.  Endorses several week history of lower left sided back pain.  Also endorses lighter yellow urine than normal.  Endorses some urgency.  Denies inciting trauma/injury to lower back. Denies fevers/chills, dysuria, frequency, hematuria.  No abd pain, nausea or vomiting.  No bowel changes.  Appetite ok.  Lasix makes her void more frequently. Not drinking as much water as she should.   No h/o UTIs.  Tylenol has helped lower back pain. Lab Results  Component Value Date   CREATININE 1.2 05/27/2012    Past Medical History  Diagnosis Date  . Hypertension 05/08/1978  . Hyperlipidemia 03/09/1995  . Diabetes mellitus 06/08/2001    type II  . Arthritis     knee  . Memory changes     with hallucinations    Past Surgical History  Procedure Laterality Date  . Abdominal hysterectomy  07/04/1985    HRT s/p partial hyst for dysmenorrhea/ menopause   Review of Systems Per HPI    Objective:   Physical Exam  Nursing note and vitals reviewed. Constitutional: She appears well-developed and well-nourished. No distress.  Significant assistance needed to get on exam table  HENT:  Mouth/Throat: Oropharynx is clear and moist. No oropharyngeal exudate.  Eyes: Conjunctivae and EOM are normal. Pupils are equal, round, and reactive to light.  Cardiovascular: Normal rate, regular rhythm and intact distal pulses.   Murmur (S2 somewhat muffled at apex) heard. Pulmonary/Chest: Effort normal and breath sounds normal. No respiratory distress. She has no wheezes. She has no rales.  Abdominal: Soft. Bowel sounds are normal. She exhibits no distension and no mass. There is no tenderness. There is no rebound and no guarding.  Musculoskeletal: She exhibits edema (nonpitting edema).       Assessment & Plan:

## 2013-05-30 NOTE — Progress Notes (Signed)
Pre-visit discussion using our clinic review tool. No additional management support is needed unless otherwise documented below in the visit note.  

## 2013-05-30 NOTE — Assessment & Plan Note (Signed)
Possible UTI - treat with cipro 250mg  twice daily for 3 days. Update if sxs don't improve. Push fluids and rest. Tylenol prn

## 2013-05-30 NOTE — Telephone Encounter (Signed)
Patient Information:  Caller Name: Trinna  Phone: [phone]  Patient: Kristen Goodman  Gender: Female  DOB: 1935-01-27  Age: 79 Years  PCP: Elsie Stain Brigitte Pulse) [facility])  Office Follow Up:  Does the office need to follow up with this patient?: No  Instructions For The Office: N/A   Symptoms  Reason For Call & Symptoms: Pt calling regarding urinary sx. Back pain and concentrated. Onset 05/23/13.  Reviewed Health History In EMR: Yes  Reviewed Medications In EMR: Yes  Reviewed Allergies In EMR: Yes  Reviewed Surgeries / Procedures: Yes  Date of Onset of Symptoms: 05/23/2013  Treatments Tried: Tylenol  Treatments Tried Worked: No  Guideline(s) Used:  Urination Pain - Female  Disposition Per Guideline:   Go to Office Now  Reason For Disposition Reached:   Side (flank) or lower back pain present  Advice Given:  N/A  Patient Will Follow Care Advice:  YES  Appointment Scheduled:  05/30/2013 16:15:00 Appointment Scheduled Provider:  Ria Bush (Family Practice)

## 2013-05-30 NOTE — Patient Instructions (Addendum)
Flu shot today. I do think you have a urinary infection Treat with 3 days of cipro antibiotic twice daily. Drink lots of water and may use tylenol as needed. Let us know if not improving as expected. Good to see you today.  Urinary Tract Infection Urinary tract infections (UTIs) can develop anywhere along your urinary tract. Your urinary tract is your body's drainage system for removing wastes and extra water. Your urinary tract includes two kidneys, two ureters, a bladder, and a urethra. Your kidneys are a pair of bean-shaped organs. Each kidney is about the size of your fist. They are located below your ribs, one on each side of your spine. CAUSES Infections are caused by microbes, which are microscopic organisms, including fungi, viruses, and bacteria. These organisms are so small that they can only be seen through a microscope. Bacteria are the microbes that most commonly cause UTIs. SYMPTOMS  Symptoms of UTIs may vary by age and gender of the patient and by the location of the infection. Symptoms in young women typically include a frequent and intense urge to urinate and a painful, burning feeling in the bladder or urethra during urination. Older women and men are more likely to be tired, shaky, and weak and have muscle aches and abdominal pain. A fever may mean the infection is in your kidneys. Other symptoms of a kidney infection include pain in your back or sides below the ribs, nausea, and vomiting. DIAGNOSIS To diagnose a UTI, your caregiver will ask you about your symptoms. Your caregiver also will ask to provide a urine sample. The urine sample will be tested for bacteria and white blood cells. White blood cells are made by your body to help fight infection. TREATMENT  Typically, UTIs can be treated with medication. Because most UTIs are caused by a bacterial infection, they usually can be treated with the use of antibiotics. The choice of antibiotic and length of treatment depend on your  symptoms and the type of bacteria causing your infection. HOME CARE INSTRUCTIONS  If you were prescribed antibiotics, take them exactly as your caregiver instructs you. Finish the medication even if you feel better after you have only taken some of the medication.  Drink enough water and fluids to keep your urine clear or pale yellow.  Avoid caffeine, tea, and carbonated beverages. They tend to irritate your bladder.  Empty your bladder often. Avoid holding urine for long periods of time.  Empty your bladder before and after sexual intercourse.  After a bowel movement, women should cleanse from front to back. Use each tissue only once. SEEK MEDICAL CARE IF:   You have back pain.  You develop a fever.  Your symptoms do not begin to resolve within 3 days. SEEK IMMEDIATE MEDICAL CARE IF:   You have severe back pain or lower abdominal pain.  You develop chills.  You have nausea or vomiting.  You have continued burning or discomfort with urination. MAKE SURE YOU:   Understand these instructions.  Will watch your condition.  Will get help right away if you are not doing well or get worse. Document Released: 02/01/2005 Document Revised: 10/24/2011 Document Reviewed: 06/02/2011 [facility] Patient Information 2014 [employer].

## 2013-05-30 NOTE — Telephone Encounter (Signed)
Seeing Dr. Darnell Level today.

## 2013-06-01 LAB — URINE CULTURE
Colony Count: NO GROWTH
Organism ID, Bacteria: NO GROWTH

## 2013-06-02 ENCOUNTER — Ambulatory Visit: Payer: Self-pay | Admitting: Family Medicine

## 2013-06-03 ENCOUNTER — Other Ambulatory Visit: Payer: Self-pay | Admitting: Family Medicine

## 2013-06-09 ENCOUNTER — Other Ambulatory Visit: Payer: Self-pay | Admitting: Family Medicine

## 2013-06-26 ENCOUNTER — Ambulatory Visit: Payer: Self-pay | Admitting: Psychology

## 2013-07-03 ENCOUNTER — Other Ambulatory Visit: Payer: Self-pay | Admitting: Family Medicine

## 2013-07-10 ENCOUNTER — Ambulatory Visit (INDEPENDENT_AMBULATORY_CARE_PROVIDER_SITE_OTHER): Payer: Medicare Other | Admitting: Psychology

## 2013-07-10 DIAGNOSIS — F39 Unspecified mood [affective] disorder: Secondary | ICD-10-CM

## 2013-07-11 ENCOUNTER — Ambulatory Visit (INDEPENDENT_AMBULATORY_CARE_PROVIDER_SITE_OTHER): Payer: Medicare Other | Admitting: Family Medicine

## 2013-07-11 ENCOUNTER — Encounter: Payer: Self-pay | Admitting: Family Medicine

## 2013-07-11 VITALS — BP 122/68 | HR 60 | Temp 98.0°F | Wt 173.8 lb

## 2013-07-11 DIAGNOSIS — Z79899 Other long term (current) drug therapy: Secondary | ICD-10-CM | POA: Diagnosis not present

## 2013-07-11 DIAGNOSIS — E7800 Pure hypercholesterolemia, unspecified: Secondary | ICD-10-CM | POA: Diagnosis not present

## 2013-07-11 DIAGNOSIS — I10 Essential (primary) hypertension: Secondary | ICD-10-CM

## 2013-07-11 DIAGNOSIS — R7309 Other abnormal glucose: Secondary | ICD-10-CM

## 2013-07-11 DIAGNOSIS — R443 Hallucinations, unspecified: Secondary | ICD-10-CM | POA: Diagnosis not present

## 2013-07-11 DIAGNOSIS — R739 Hyperglycemia, unspecified: Secondary | ICD-10-CM

## 2013-07-11 DIAGNOSIS — E785 Hyperlipidemia, unspecified: Secondary | ICD-10-CM

## 2013-07-11 LAB — CBC WITH DIFFERENTIAL/PLATELET
BASOS ABS: 0 10*3/uL (ref 0.0–0.1)
Basophils Relative: 0.5 % (ref 0.0–3.0)
Eosinophils Absolute: 0.1 10*3/uL (ref 0.0–0.7)
Eosinophils Relative: 3.1 % (ref 0.0–5.0)
HEMATOCRIT: 37.9 % (ref 36.0–46.0)
Hemoglobin: 12.5 g/dL (ref 12.0–15.0)
LYMPHS ABS: 1.1 10*3/uL (ref 0.7–4.0)
Lymphocytes Relative: 25.8 % (ref 12.0–46.0)
MCHC: 32.9 g/dL (ref 30.0–36.0)
MCV: 85.3 fl (ref 78.0–100.0)
MONOS PCT: 7.3 % (ref 3.0–12.0)
Monocytes Absolute: 0.3 10*3/uL (ref 0.1–1.0)
NEUTROS PCT: 63.3 % (ref 43.0–77.0)
Neutro Abs: 2.6 10*3/uL (ref 1.4–7.7)
PLATELETS: 164 10*3/uL (ref 150.0–400.0)
RBC: 4.44 Mil/uL (ref 3.87–5.11)
RDW: 14.4 % (ref 11.5–14.6)
WBC: 4.1 10*3/uL — ABNORMAL LOW (ref 4.5–10.5)

## 2013-07-11 LAB — COMPREHENSIVE METABOLIC PANEL
ALBUMIN: 3.7 g/dL (ref 3.5–5.2)
ALT: 19 U/L (ref 0–35)
AST: 20 U/L (ref 0–37)
Alkaline Phosphatase: 41 U/L (ref 39–117)
BILIRUBIN TOTAL: 1.3 mg/dL — AB (ref 0.3–1.2)
BUN: 14 mg/dL (ref 6–23)
CALCIUM: 9.5 mg/dL (ref 8.4–10.5)
CHLORIDE: 108 meq/L (ref 96–112)
CO2: 26 meq/L (ref 19–32)
Creatinine, Ser: 1.2 mg/dL (ref 0.4–1.2)
GFR: 55.54 mL/min — AB (ref >60.00)
GLUCOSE: 115 mg/dL — AB (ref 70–99)
Potassium: 3.8 mEq/L (ref 3.5–5.1)
SODIUM: 142 meq/L (ref 135–145)
TOTAL PROTEIN: 7 g/dL (ref 6.0–8.3)

## 2013-07-11 LAB — HEMOGLOBIN A1C: Hgb A1c MFr Bld: 5.8 % (ref 4.6–6.5)

## 2013-07-11 LAB — LDL CHOLESTEROL, DIRECT: Direct LDL: 40.3 mg/dL

## 2013-07-11 NOTE — Progress Notes (Signed)
Pre visit review using our clinic review tool, if applicable. No additional management support is needed unless otherwise documented below in the visit note.  She was reportedly discharged by her psych clinic w/o a f/u plan- her insurance was no longer accepted. Needs to f/u here. Hallucinations controlled, none now, back to baseline mentation. No SI/HI.   No visions, etc.  Doing well with current med, was able to break abilify in half w/o return of sx. She is happy about the control of sx. Compliant.   H/o hyperglycemia.  Due for f/u labs.  See notes on labs.    Elevated Cholesterol: Using medications without problems:yes Muscle aches: no Diet compliance:discussed Exercise:limited  Hypertension:    Using medication without problems or lightheadedness: yes Chest pain with exertion:no Edema:at baseline Short of breath:no  Meds, vitals, and allergies reviewed.   ROS: See HPI.  Otherwise, noncontributory.  nad ncat Mmm rrr ctab abd soft, not ttp Ext with 2+ edema, at baseline Speech and judgement wnl, good insight.

## 2013-07-11 NOTE — Patient Instructions (Signed)
Go to the lab on the way out.  We'll contact you with your lab report.  Don't change your meds for now.  If you need refills, have the pharmacy call her.  Take care.  Glad to see you.

## 2013-07-12 ENCOUNTER — Encounter: Payer: Self-pay | Admitting: Family Medicine

## 2013-07-12 NOTE — Assessment & Plan Note (Signed)
Controlled, continue as is.  

## 2013-07-12 NOTE — Assessment & Plan Note (Signed)
Controlled, continue current meds. Will continue to get med through here, I will rx.  She agrees.

## 2013-07-12 NOTE — Assessment & Plan Note (Signed)
Controlled, will monitor esp with abilify use.  See notes on labs.

## 2013-07-23 ENCOUNTER — Other Ambulatory Visit: Payer: Self-pay | Admitting: Family Medicine

## 2013-07-24 ENCOUNTER — Ambulatory Visit (INDEPENDENT_AMBULATORY_CARE_PROVIDER_SITE_OTHER): Payer: Medicare Other | Admitting: Psychology

## 2013-07-24 DIAGNOSIS — F39 Unspecified mood [affective] disorder: Secondary | ICD-10-CM | POA: Diagnosis not present

## 2013-08-11 ENCOUNTER — Other Ambulatory Visit: Payer: Self-pay | Admitting: Family Medicine

## 2013-08-28 ENCOUNTER — Ambulatory Visit (INDEPENDENT_AMBULATORY_CARE_PROVIDER_SITE_OTHER): Payer: Medicare Other | Admitting: Psychology

## 2013-08-28 ENCOUNTER — Other Ambulatory Visit: Payer: Self-pay

## 2013-08-28 DIAGNOSIS — F39 Unspecified mood [affective] disorder: Secondary | ICD-10-CM

## 2013-08-28 MED ORDER — ARIPIPRAZOLE 5 MG PO TABS: 2.5000 mg | ORAL_TABLET | Freq: Every day | ORAL | Status: DC

## 2013-08-28 NOTE — Telephone Encounter (Signed)
Sent!

## 2013-08-28 NOTE — Telephone Encounter (Signed)
Note was left requesting refill abilify to [facility]. Request cb when filled.

## 2013-10-04 ENCOUNTER — Other Ambulatory Visit: Payer: Self-pay | Admitting: Family Medicine

## 2013-10-16 ENCOUNTER — Ambulatory Visit (INDEPENDENT_AMBULATORY_CARE_PROVIDER_SITE_OTHER): Payer: Medicare Other | Admitting: Psychology

## 2013-10-16 DIAGNOSIS — F39 Unspecified mood [affective] disorder: Secondary | ICD-10-CM | POA: Diagnosis not present

## 2013-11-10 ENCOUNTER — Other Ambulatory Visit: Payer: Self-pay | Admitting: Family Medicine

## 2013-11-20 DIAGNOSIS — H10529 Angular blepharoconjunctivitis, unspecified eye: Secondary | ICD-10-CM | POA: Diagnosis not present

## 2013-12-04 ENCOUNTER — Ambulatory Visit (INDEPENDENT_AMBULATORY_CARE_PROVIDER_SITE_OTHER): Payer: Medicare Other | Admitting: Psychology

## 2013-12-04 DIAGNOSIS — F39 Unspecified mood [affective] disorder: Secondary | ICD-10-CM

## 2014-01-08 ENCOUNTER — Other Ambulatory Visit: Payer: Self-pay | Admitting: Family Medicine

## 2014-01-19 ENCOUNTER — Encounter: Payer: Self-pay | Admitting: Family Medicine

## 2014-01-19 ENCOUNTER — Ambulatory Visit (INDEPENDENT_AMBULATORY_CARE_PROVIDER_SITE_OTHER): Payer: Medicare Other | Admitting: Family Medicine

## 2014-01-19 VITALS — BP 122/74 | HR 57 | Temp 97.7°F | Resp 16 | Ht 62.0 in | Wt 171.8 lb

## 2014-01-19 DIAGNOSIS — Z23 Encounter for immunization: Secondary | ICD-10-CM | POA: Diagnosis not present

## 2014-01-19 DIAGNOSIS — R7309 Other abnormal glucose: Secondary | ICD-10-CM

## 2014-01-19 DIAGNOSIS — M545 Low back pain, unspecified: Secondary | ICD-10-CM

## 2014-01-19 DIAGNOSIS — I10 Essential (primary) hypertension: Secondary | ICD-10-CM

## 2014-01-19 DIAGNOSIS — R7989 Other specified abnormal findings of blood chemistry: Secondary | ICD-10-CM | POA: Diagnosis not present

## 2014-01-19 DIAGNOSIS — R443 Hallucinations, unspecified: Secondary | ICD-10-CM

## 2014-01-19 DIAGNOSIS — R739 Hyperglycemia, unspecified: Secondary | ICD-10-CM

## 2014-01-19 LAB — CBC WITH DIFFERENTIAL/PLATELET
BASOS ABS: 0 10*3/uL (ref 0.0–0.1)
Basophils Relative: 0.6 % (ref 0.0–3.0)
Eosinophils Absolute: 0.1 10*3/uL (ref 0.0–0.7)
Eosinophils Relative: 3 % (ref 0.0–5.0)
HEMATOCRIT: 37.6 % (ref 36.0–46.0)
Hemoglobin: 12.5 g/dL (ref 12.0–15.0)
LYMPHS ABS: 1.1 10*3/uL (ref 0.7–4.0)
LYMPHS PCT: 27.2 % (ref 12.0–46.0)
MCHC: 33.2 g/dL (ref 30.0–36.0)
MCV: 84.8 fl (ref 78.0–100.0)
Monocytes Absolute: 0.4 10*3/uL (ref 0.1–1.0)
Monocytes Relative: 9.1 % (ref 3.0–12.0)
Neutro Abs: 2.4 10*3/uL (ref 1.4–7.7)
Neutrophils Relative %: 60.1 % (ref 43.0–77.0)
PLATELETS: 153 10*3/uL (ref 150.0–400.0)
RBC: 4.44 Mil/uL (ref 3.87–5.11)
RDW: 14.7 % (ref 11.5–15.5)
WBC: 4 10*3/uL (ref 4.0–10.5)

## 2014-01-19 LAB — HEMOGLOBIN A1C: HEMOGLOBIN A1C: 5.9 % (ref 4.6–6.5)

## 2014-01-19 MED ORDER — ROSUVASTATIN CALCIUM 5 MG PO TABS: ORAL_TABLET | ORAL | Status: DC

## 2014-01-19 MED ORDER — ARIPIPRAZOLE 5 MG PO TABS: 2.5000 mg | ORAL_TABLET | Freq: Every day | ORAL | Status: DC

## 2014-01-19 MED ORDER — FUROSEMIDE 40 MG PO TABS: ORAL_TABLET | ORAL | Status: DC

## 2014-01-19 MED ORDER — METOPROLOL TARTRATE 100 MG PO TABS: ORAL_TABLET | ORAL | Status: DC

## 2014-01-19 NOTE — Patient Instructions (Addendum)
Take care.  Glad to see you.  Don't change your meds.  Recheck in about 6 months.  Call if questions.  Go to the lab on the way out.  We'll contact you with your lab report.

## 2014-01-19 NOTE — Progress Notes (Signed)
Hypertension: Using medication without problems or lightheadedness: yes Chest pain with exertion:no Edema: some, at baseline, BLE, R>L at baseline Short of breath:no  H/o mild hyperglycemia.  Not on meds.  A1c controlled on last check.  D/w pt.    Due for flu shot.  D/w pt.   H/o hallucinations.  None now.  Compliant with meds.  No ADE on meds.  A&O, 3/3 on recall.  Speech at baseline, normal.  Judgement appears normal.    She has had no MVAs. She is considering driving less.  Discussed.  She has some help with transportation.  I don't want her isolated at home and she agrees with that.    Has occ back pain, better with tylenol.  L lower back pain.  No dysuria.  Has a heating pad, but hasn't used it yet.    Meds, vitals, and allergies reviewed.   PMH and SH reviewed  ROS: See HPI.  Otherwise negative.    GEN: nad, alert and oriented HEENT: mucous membranes moist NECK: supple w/o LA CV: rrr. PULM: ctab, no inc wob ABD: soft, +bs EXT: BLE edema, R>L at baseline SKIN: no acute rash No tremor Back w/o midline pain. Slightly ttp in L lower back w/o rash

## 2014-01-20 ENCOUNTER — Encounter: Payer: Self-pay | Admitting: Family Medicine

## 2014-01-20 NOTE — Assessment & Plan Note (Addendum)
Controlled, continue as is.  D/w pt.  She agrees. >25 minutes spent in face to face time with patient, >50% spent in counselling or coordination of care.

## 2014-01-20 NOTE — Assessment & Plan Note (Signed)
H/o, a1c okay, continue as is.

## 2014-01-20 NOTE — Assessment & Plan Note (Signed)
Likely muscular, no need to image, use tylenol and heat and f/u prn.  She agrees.

## 2014-01-20 NOTE — Assessment & Plan Note (Signed)
Continue current meds.  CBC okay.  Improved as is.

## 2014-01-22 DIAGNOSIS — H04129 Dry eye syndrome of unspecified lacrimal gland: Secondary | ICD-10-CM | POA: Diagnosis not present

## 2014-01-29 ENCOUNTER — Ambulatory Visit (INDEPENDENT_AMBULATORY_CARE_PROVIDER_SITE_OTHER): Payer: Medicare Other | Admitting: Psychology

## 2014-01-29 DIAGNOSIS — F39 Unspecified mood [affective] disorder: Secondary | ICD-10-CM | POA: Diagnosis not present

## 2014-03-02 ENCOUNTER — Other Ambulatory Visit: Payer: Self-pay

## 2014-03-02 DIAGNOSIS — Z1231 Encounter for screening mammogram for malignant neoplasm of breast: Secondary | ICD-10-CM

## 2014-03-23 ENCOUNTER — Ambulatory Visit
Admission: RE | Admit: 2014-03-23 | Discharge: 2014-03-23 | Disposition: A | Discharge status: Home / Self Care | Payer: Medicare Other | Source: Ambulatory Visit

## 2014-03-23 DIAGNOSIS — Z1231 Encounter for screening mammogram for malignant neoplasm of breast: Secondary | ICD-10-CM | POA: Diagnosis not present

## 2014-03-24 ENCOUNTER — Encounter: Payer: Self-pay | Admitting: *Deleted

## 2014-04-06 ENCOUNTER — Emergency Department (HOSPITAL_COMMUNITY): Payer: Medicare Other

## 2014-04-06 ENCOUNTER — Encounter (HOSPITAL_COMMUNITY): Payer: Self-pay | Admitting: Emergency Medicine

## 2014-04-06 ENCOUNTER — Telehealth: Payer: Self-pay | Admitting: Family Medicine

## 2014-04-06 ENCOUNTER — Emergency Department (HOSPITAL_COMMUNITY)
Admission: EM | Admit: 2014-04-06 | Discharge: 2014-04-06 | Disposition: A | Discharge status: Home / Self Care | Payer: Medicare Other | Attending: Emergency Medicine | Admitting: Emergency Medicine

## 2014-04-06 DIAGNOSIS — Z8739 Personal history of other diseases of the musculoskeletal system and connective tissue: Secondary | ICD-10-CM | POA: Insufficient documentation

## 2014-04-06 DIAGNOSIS — R42 Dizziness and giddiness: Secondary | ICD-10-CM | POA: Diagnosis not present

## 2014-04-06 DIAGNOSIS — I10 Essential (primary) hypertension: Secondary | ICD-10-CM | POA: Insufficient documentation

## 2014-04-06 DIAGNOSIS — Z79899 Other long term (current) drug therapy: Secondary | ICD-10-CM | POA: Insufficient documentation

## 2014-04-06 DIAGNOSIS — R079 Chest pain, unspecified: Secondary | ICD-10-CM | POA: Diagnosis not present

## 2014-04-06 DIAGNOSIS — E785 Hyperlipidemia, unspecified: Secondary | ICD-10-CM | POA: Diagnosis not present

## 2014-04-06 DIAGNOSIS — R531 Weakness: Secondary | ICD-10-CM | POA: Diagnosis not present

## 2014-04-06 DIAGNOSIS — R918 Other nonspecific abnormal finding of lung field: Secondary | ICD-10-CM | POA: Diagnosis not present

## 2014-04-06 DIAGNOSIS — E119 Type 2 diabetes mellitus without complications: Secondary | ICD-10-CM | POA: Insufficient documentation

## 2014-04-06 DIAGNOSIS — R002 Palpitations: Secondary | ICD-10-CM | POA: Insufficient documentation

## 2014-04-06 LAB — I-STAT TROPONIN, ED: TROPONIN I, POC: 0 ng/mL (ref 0.00–0.08)

## 2014-04-06 LAB — CBC
HCT: 39.8 % (ref 36.0–46.0)
HEMOGLOBIN: 13.1 g/dL (ref 12.0–15.0)
MCH: 27.3 pg (ref 26.0–34.0)
MCHC: 32.9 g/dL (ref 30.0–36.0)
MCV: 82.9 fL (ref 78.0–100.0)
PLATELETS: 150 10*3/uL (ref 150–400)
RBC: 4.8 MIL/uL (ref 3.87–5.11)
RDW: 13.6 % (ref 11.5–15.5)
WBC: 4 10*3/uL (ref 4.0–10.5)

## 2014-04-06 LAB — BASIC METABOLIC PANEL
ANION GAP: 13 (ref 5–15)
BUN: 14 mg/dL (ref 6–23)
CALCIUM: 9.6 mg/dL (ref 8.4–10.5)
CHLORIDE: 108 meq/L (ref 96–112)
CO2: 23 mEq/L (ref 19–32)
Creatinine, Ser: 1.07 mg/dL (ref 0.50–1.10)
GFR calc Af Amer: 57 mL/min — ABNORMAL LOW (ref >90)
GFR calc non Af Amer: 49 mL/min — ABNORMAL LOW (ref >90)
Glucose, Bld: 104 mg/dL — ABNORMAL HIGH (ref 70–99)
Potassium: 4 mEq/L (ref 3.7–5.3)
SODIUM: 144 meq/L (ref 137–147)

## 2014-04-06 LAB — PRO B NATRIURETIC PEPTIDE: Pro B Natriuretic peptide (BNP): 129.1 pg/mL (ref 0–450)

## 2014-04-06 LAB — TROPONIN I

## 2014-04-06 MED ORDER — ASPIRIN 81 MG PO CHEW
324.0000 mg | CHEWABLE_TABLET | Freq: Once | ORAL | Status: AC
  Administered 2014-04-06: 324 mg via ORAL
  Filled 2014-04-06: qty 4

## 2014-04-06 NOTE — Discharge Instructions (Signed)
Call the cardiology office to arrange a follow-up appointment. The contact information has been provided on this discharge summary.  Return to the emergency department if you develop significant worsening of your symptoms, or other new and concerning symptoms.   Palpitations A palpitation is the feeling that your heartbeat is irregular or is faster than normal. It may feel like your heart is fluttering or skipping a beat. Palpitations are usually not a serious problem. However, in some cases, you may need further medical evaluation. CAUSES  Palpitations can be caused by:  Smoking.  Caffeine or other stimulants, such as diet pills or energy drinks.  Alcohol.  Stress and anxiety.  Strenuous physical activity.  Fatigue.  Certain medicines.  Heart disease, especially if you have a history of irregular heart rhythms (arrhythmias), such as atrial fibrillation, atrial flutter, or supraventricular tachycardia.  An improperly working pacemaker or defibrillator. DIAGNOSIS  To find the cause of your palpitations, your health care provider will take your medical history and perform a physical exam. Your health care provider may also have you take a test called an ambulatory electrocardiogram (ECG). An ECG records your heartbeat patterns over a 24-hour period. You may also have other tests, such as:  Transthoracic echocardiogram (TTE). During echocardiography, sound waves are used to evaluate how blood flows through your heart.  Transesophageal echocardiogram (TEE).  Cardiac monitoring. This allows your health care provider to monitor your heart rate and rhythm in real time.  Holter monitor. This is a portable device that records your heartbeat and can help diagnose heart arrhythmias. It allows your health care provider to track your heart activity for several days, if needed.  Stress tests by exercise or by giving medicine that makes the heart beat faster. TREATMENT  Treatment of  palpitations depends on the cause of your symptoms and can vary greatly. Most cases of palpitations do not require any treatment other than time, relaxation, and monitoring your symptoms. Other causes, such as atrial fibrillation, atrial flutter, or supraventricular tachycardia, usually require further treatment. HOME CARE INSTRUCTIONS   Avoid:  Caffeinated coffee, tea, soft drinks, diet pills, and energy drinks.  Chocolate.  Alcohol.  Stop smoking if you smoke.  Reduce your stress and anxiety. Things that can help you relax include:  A method of controlling things in your body, such as your heartbeats, with your mind (biofeedback).  Yoga.  Meditation.  Physical activity such as swimming, jogging, or walking.  Get plenty of rest and sleep. SEEK MEDICAL CARE IF:   You continue to have a fast or irregular heartbeat beyond 24 hours.  Your palpitations occur more often. SEEK IMMEDIATE MEDICAL CARE IF:  You have chest pain or shortness of breath.  You have a severe headache.  You feel dizzy or you faint. MAKE SURE YOU:  Understand these instructions.  Will watch your condition.  Will get help right away if you are not doing well or get worse. Document Released: 04/21/2000 Document Revised: 04/29/2013 Document Reviewed: 06/23/2011 [facility] Patient Information 2015 [place], [place]. This information is not intended to replace advice given to you by your health care provider. Make sure you discuss any questions you have with your health care provider.  Chest Pain (Nonspecific) It is often hard to give a specific diagnosis for the cause of chest pain. There is always a chance that your pain could be related to something serious, such as a heart attack or a blood clot in the lungs. You need to follow up with your health  care provider for further evaluation. CAUSES   Heartburn.  Pneumonia or bronchitis.  Anxiety or stress.  Inflammation around your heart (pericarditis) or  lung (pleuritis or pleurisy).  A blood clot in the lung.  A collapsed lung (pneumothorax). It can develop suddenly on its own (spontaneous pneumothorax) or from trauma to the chest.  Shingles infection (herpes zoster virus). The chest wall is composed of bones, muscles, and cartilage. Any of these can be the source of the pain.  The bones can be bruised by injury.  The muscles or cartilage can be strained by coughing or overwork.  The cartilage can be affected by inflammation and become sore (costochondritis). DIAGNOSIS  Lab tests or other studies may be needed to find the cause of your pain. Your health care provider may have you take a test called an ambulatory electrocardiogram (ECG). An ECG records your heartbeat patterns over a 24-hour period. You may also have other tests, such as:  Transthoracic echocardiogram (TTE). During echocardiography, sound waves are used to evaluate how blood flows through your heart.  Transesophageal echocardiogram (TEE).  Cardiac monitoring. This allows your health care provider to monitor your heart rate and rhythm in real time.  Holter monitor. This is a portable device that records your heartbeat and can help diagnose heart arrhythmias. It allows your health care provider to track your heart activity for several days, if needed.  Stress tests by exercise or by giving medicine that makes the heart beat faster. TREATMENT   Treatment depends on what may be causing your chest pain. Treatment may include:  Acid blockers for heartburn.  Anti-inflammatory medicine.  Pain medicine for inflammatory conditions.  Antibiotics if an infection is present.  You may be advised to change lifestyle habits. This includes stopping smoking and avoiding alcohol, caffeine, and chocolate.  You may be advised to keep your head raised (elevated) when sleeping. This reduces the chance of acid going backward from your stomach into your esophagus. Most of the time,  nonspecific chest pain will improve within 2-3 days with rest and mild pain medicine.  HOME CARE INSTRUCTIONS   If antibiotics were prescribed, take them as directed. Finish them even if you start to feel better.  For the next few days, avoid physical activities that bring on chest pain. Continue physical activities as directed.  Do not use any tobacco products, including cigarettes, chewing tobacco, or electronic cigarettes.  Avoid drinking alcohol.  Only take medicine as directed by your health care provider.  Follow your health care provider's suggestions for further testing if your chest pain does not go away.  Keep any follow-up appointments you made. If you do not go to an appointment, you could develop lasting (chronic) problems with pain. If there is any problem keeping an appointment, call to reschedule. SEEK MEDICAL CARE IF:   Your chest pain does not go away, even after treatment.  You have a rash with blisters on your chest.  You have a fever. SEEK IMMEDIATE MEDICAL CARE IF:   You have increased chest pain or pain that spreads to your arm, neck, jaw, back, or abdomen.  You have shortness of breath.  You have an increasing cough, or you cough up blood.  You have severe back or abdominal pain.  You feel nauseous or vomit.  You have severe weakness.  You faint.  You have chills. This is an emergency. Do not wait to see if the pain will go away. Get medical help at once. Call your local  emergency services (911 in [place] Do not drive yourself to the hospital. MAKE SURE YOU:   Understand these instructions.  Will watch your condition.  Will get help right away if you are not doing well or get worse. Document Released: 02/01/2005 Document Revised: 04/29/2013 Document Reviewed: 11/28/2007 [facility] Patient Information 2015 [place], [place]. This information is not intended to replace advice given to you by your health care provider. Make sure you discuss any  questions you have with your health care provider.

## 2014-04-06 NOTE — ED Provider Notes (Signed)
Care signed out to me at shift change awaiting results of a second troponin. This returned negative. Per Dr. Johnsie Kindred assessment, she is appropriate for discharge. I discussed these results with the patient and informed her the importance of follow-up with her primary Dr. and return as needed if she experiences any additional problems.  Veryl Speak, MD 04/06/14 [phone]

## 2014-04-06 NOTE — Telephone Encounter (Signed)
Agreed. Thanks.  If she has any other episodes, she is to go to the ED.

## 2014-04-06 NOTE — ED Notes (Signed)
Per EMS: from home, woke up 0930, doing daily activities, at 1000 palpations lasted 10 minutes per family.  During this time she was also dizzy, no loc, syncope, trauma.  Slight headache, posterior, no vision changes.  Was able to eat bowl of cereal after symptoms subsided.  Has had feeling before in past.

## 2014-04-06 NOTE — Telephone Encounter (Signed)
Patient Information:  Caller Name: Legrand Como  Phone: [phone]  Patient: Kristen Goodman  Gender: Female  DOB: 1935/01/16  Age: 79 Years  PCP: Elsie Stain Brigitte Pulse) [facility])  Office Follow Up:  Does the office need to follow up with this patient?: Yes  Instructions For The Office: No appts. today. They would like to see Dr. Damita Dunnings. Appt. scheduled for 04/08/14 at 14:00. Declined sooner appts. with other Providers.Advised if she has any other episodes, she is to call or go to the ED. Emergent symptoms reviewed with the son. If there are any cancellations for today or 04/07/14 please call the pt. [phone]).  RN Note:  No appts. today. They would like to see Dr. Damita Dunnings. Appt. scheduled for 04/08/14 at 14:00. Declined sooner appts. with other Providers.Advised if she has any other episodes, she is to call or go to the ED. Emergent symptoms reviewed with the son.  Symptoms  Reason For Call & Symptoms: Pt. woke up this morning and her heart was racing. Lasted a few seconds according to the son. Thought she may have had a bad dream.Pt. denies chest pain, SOB, arm pain., Pulse was regular when the son felt of her arm, but did not count the pulse. Pt. denies any palpitations. Able to stand without help. Denies dehydration.  Reviewed Health History In EMR: Yes  Reviewed Medications In EMR: Yes  Reviewed Allergies In EMR: Yes  Reviewed Surgeries / Procedures: Yes  Date of Onset of Symptoms: 04/06/2014  Guideline(s) Used:  Heart Rate and Heartbeat Questions  Disposition Per Guideline:   See Today in Office  Reason For Disposition Reached:   Age > 60 years  Advice Given:  Call Back If:  Chest pain, lightheadedness, or difficulty breathing occurs  Heart beating more than 130 beats / minute  More than 3 extra or skipped beats / minute  You become worse.  Patient Will Follow Care Advice:  YES  Appointment Scheduled:  04/08/2014 14:00:00 Appointment Scheduled  Provider:  Elsie Stain Brigitte Pulse) [facility])

## 2014-04-06 NOTE — ED Provider Notes (Signed)
CSN: [id number]     Arrival date & time 04/06/14  1223 History   First MD Initiated Contact with Patient 04/06/14 1231     Chief Complaint  Patient presents with  . Palpitations     (Consider location/radiation/quality/duration/timing/severity/associated sxs/prior Treatment) HPI  Patient reports she woke up about 9:15 this morning which is her usual time to get up and she had a aching chest pain in the center of her chest. She denies nausea, vomiting, shortness of breath or diaphoresis. She states when she stood up she felt weak and felt like she was going to pass out. She states the discomfort lasted 5-10 minutes. She then had a second episode that lasted about the same. Also during these episodes her nephew states she was complaining of feeling like her heart was beating fast. She states her pain at its worse was a 5 out of 10, currently her pain is gone and is a 0 out of 10. Her nephew states she has anxiety and she has had chest pain before that they thought was from anxiety. She denies having any upsetting events in her life at this time.  PCP Dr Damita Dunnings  Past Medical History  Diagnosis Date  . Hypertension 05/08/1978  . Hyperlipidemia 03/09/1995  . Diabetes mellitus 06/08/2001    type II  . Arthritis     knee  . Memory changes     with hallucinations   Past Surgical History  Procedure Laterality Date  . Abdominal hysterectomy  07/04/1985    HRT s/p partial hyst for dysmenorrhea/ menopause   Family History  Problem Relation Age of Onset  . Hypertension Mother   . Diabetes Father   . Hypertension Father   . Heart disease Sister   . Diabetes Brother   . Cancer Neg Hx   . Alcohol abuse Neg Hx   . Depression Neg Hx   . Breast cancer Neg Hx   . Colon cancer Neg Hx   . Diabetes Brother    History  Substance Use Topics  . Smoking status: Never Smoker   . Smokeless tobacco: Never Used  . Alcohol Use: No   Lives with her brother and nephew  OB History    No data  available     Review of Systems  All other systems reviewed and are negative.     Allergies  Risperdal  Home Medications   Prior to Admission medications   Medication Sig Start Date End Date Taking? Authorizing Provider  acetaminophen (TYLENOL) 500 MG tablet Take 500 mg by mouth as needed.     Yes Historical Provider, MD  amLODipine (NORVASC) 5 MG tablet TAKE 1 TABLET BY MOUTH EVERY DAY 08/11/13  Yes Tonia Ghent, MD  ARIPiprazole (ABILIFY) 5 MG tablet Take 0.5 tablets (2.5 mg total) by mouth daily. 01/19/14  Yes Tonia Ghent, MD  captopril (CAPOTEN) 50 MG tablet TAKE ONE BY MOUTH TWO TIMES A DAY 01/08/14  Yes Tonia Ghent, MD  furosemide (LASIX) 40 MG tablet TAKE 1 TABLET BY MOUTH ONCE A DAY 01/19/14  Yes Tonia Ghent, MD  metoprolol (LOPRESSOR) 100 MG tablet TAKE 1 TABLET BY MOUTH TWICE A DAY 01/19/14  Yes Tonia Ghent, MD  rosuvastatin (CRESTOR) 5 MG tablet TAKE 1 TABLET (5 MG TOTAL) BY MOUTH DAILY. 01/19/14  Yes Tonia Ghent, MD  Cholecalciferol 1000 UNITS capsule Take 1 capsule (1,000 Units total) by mouth daily. Patient not taking: Reported on 04/06/2014 05/31/12   Elveria Rising  Damita Dunnings, MD   BP 126/56 mmHg  Pulse 58  Temp(Src) 98.2 F (36.8 C) (Oral)  Resp 23  Ht 5\' 2"  (1.575 m)  Wt 170 lb (77.111 kg)  BMI 31.09 kg/m2  SpO2 100%  Vital signs normal except bradycardia  Physical Exam  Constitutional: She is oriented to person, place, and time. She appears well-developed and well-nourished.  Non-toxic appearance. She does not appear ill. No distress.  HENT:  Head: Normocephalic and atraumatic.  Right Ear: External ear normal.  Left Ear: External ear normal.  Nose: Nose normal. No mucosal edema or rhinorrhea.  Mouth/Throat: Oropharynx is clear and moist and mucous membranes are normal. No dental abscesses or uvula swelling.  Patient has facial hair with a mustache  Eyes: Conjunctivae and EOM are normal. Pupils are equal, round, and reactive to light.  Neck:  Normal range of motion and full passive range of motion without pain. Neck supple.  Cardiovascular: Normal rate, regular rhythm and normal heart sounds.  Exam reveals no gallop and no friction rub.   No murmur heard. Pulmonary/Chest: Effort normal and breath sounds normal. No respiratory distress. She has no wheezes. She has no rhonchi. She has no rales. She exhibits no tenderness and no crepitus.  Abdominal: Soft. Normal appearance and bowel sounds are normal. She exhibits no distension. There is no tenderness. There is no rebound and no guarding.  Musculoskeletal: Normal range of motion. She exhibits no edema or tenderness.  Moves all extremities well.   Neurological: She is alert and oriented to person, place, and time. She has normal strength. No cranial nerve deficit.  Skin: Skin is warm, dry and intact. No rash noted. No erythema. No pallor.  Psychiatric: She has a normal mood and affect. Her speech is normal and behavior is normal. Her mood appears not anxious.  Nursing note and vitals reviewed.   ED Course  Procedures (including critical care time)  Medications  aspirin chewable tablet 324 mg (324 mg Oral Given 04/06/14 1409)    Pt given ASA, she was pain free during my exam.  16:40 Pt turned over to Dr Stark Jock at check her second troponin.    Labs Review Results for orders placed or performed during the hospital encounter of 04/06/14  CBC  Result Value Ref Range   WBC 4.0 4.0 - 10.5 K/uL   RBC 4.80 3.87 - 5.11 MIL/uL   Hemoglobin 13.1 12.0 - 15.0 g/dL   HCT 39.8 36.0 - 46.0 %   MCV 82.9 78.0 - 100.0 fL   MCH 27.3 26.0 - 34.0 pg   MCHC 32.9 30.0 - 36.0 g/dL   RDW 13.6 11.5 - 15.5 %   Platelets 150 150 - 400 K/uL  Basic metabolic panel  Result Value Ref Range   Sodium 144 137 - 147 mEq/L   Potassium 4.0 3.7 - 5.3 mEq/L   Chloride 108 96 - 112 mEq/L   CO2 23 19 - 32 mEq/L   Glucose, Bld 104 (H) 70 - 99 mg/dL   BUN 14 6 - 23 mg/dL   Creatinine, Ser 1.07 0.50 - 1.10  mg/dL   Calcium 9.6 8.4 - 10.5 mg/dL   GFR calc non Af Amer 49 (L) >90 mL/min   GFR calc Af Amer 57 (L) >90 mL/min   Anion gap 13 5 - 15  BNP (order ONLY if patient complains of dyspnea/SOB AND you have documented it for THIS visit)  Result Value Ref Range   Pro B Natriuretic peptide (BNP) 129.1  0 - 450 pg/mL  I-stat troponin, ED (not at [facility])  Result Value Ref Range   Troponin i, poc 0.00 0.00 - 0.08 ng/mL   Comment 3            Laboratory interpretation all normal except     Imaging Review Dg Chest Port 1 View  04/06/2014   CLINICAL DATA:  palpitations.  Weakness.  EXAM: PORTABLE CHEST - 1 VIEW  COMPARISON:  None.  FINDINGS: Numerous leads and wires project over the chest. Patient rotated minimally left. Normal heart size. No pleural effusion or pneumothorax. Low lung volumes with resultant pulmonary interstitial prominence. Mild left base airspace disease.  IMPRESSION: Low lung volumes. Left base opacity which is likely atelectasis. Consider PA and lateral radiographs.  No congestive heart failure identified.   Electronically Signed   By: Abigail Miyamoto M.D.   On: 04/06/2014 13:21     EKG Interpretation   Date/Time:  Monday April 06 2014 12:33:13 EST Ventricular Rate:  59 PR Interval:  166 QRS Duration: 145 QT Interval:  476 QTC Calculation: 472 R Axis:   -3 Text Interpretation:  Sinus rhythm LAE, consider biatrial enlargement  Right bundle branch block No old tracing to compare Confirmed by Ludene Stokke   MD-I, Enez Monahan ([id number]) on 04/06/2014 1:10:58 PM      MDM   Final diagnoses:  Chest pain, unspecified chest pain type  Palpitations    Disposition pending 2nd troponin   Rolland Porter, MD, Abram Sander     Janice Norrie, MD 04/06/14 [phone]

## 2014-04-08 ENCOUNTER — Encounter: Payer: Self-pay | Admitting: Family Medicine

## 2014-04-08 ENCOUNTER — Ambulatory Visit (INDEPENDENT_AMBULATORY_CARE_PROVIDER_SITE_OTHER): Payer: Medicare Other | Admitting: Family Medicine

## 2014-04-08 VITALS — BP 128/70 | HR 60 | Temp 98.5°F | Wt 168.0 lb

## 2014-04-08 DIAGNOSIS — R42 Dizziness and giddiness: Secondary | ICD-10-CM | POA: Diagnosis not present

## 2014-04-08 DIAGNOSIS — R002 Palpitations: Secondary | ICD-10-CM

## 2014-04-08 LAB — TSH: TSH: 1.56 u[IU]/mL (ref 0.35–4.50)

## 2014-04-08 NOTE — Patient Instructions (Signed)
Go to the lab on the way out.  We'll contact you with your lab report (thyroid test). It still isn't clear what happened.  I wonder if you could have been slightly dehydrated.   That could have made you lightheaded and may have caused a sensation of heart racing.  Your tests at the hospital were okay.  I would keep the appointment with cardiology- Dr. Johnsie Cancel.  If you have other troubles then notify us.  Drink enough fluid to keep your urine light yellow.  If it gets darker, then you need to drink more fluids.  Take care.  Glad to see you.

## 2014-04-08 NOTE — Progress Notes (Signed)
Pre visit review using our clinic review tool, if applicable. No additional management support is needed unless otherwise documented below in the visit note.  She felt lightheaded, presyncopal, no syncope Monday AM.  She felt her heart racing, no CP or BLE.  BLE edema at baseline.  No med changes.   ER eval unremarkable and reviewed with patient, d/c'd to home with cards f/u pending.   She doesn't recall episodes like this prev, none since.  No triggers known to patient.    She can walk w/o CP, but she doesn't have the ability to walk long distances "since I get tired."  She denies SOB, "I just get tired."  She could walk a city block, but that is her baseline and not acutely worse.    1 serving of caffeine a day, at baseline.    Meds, vitals, and allergies reviewed.   ROS: See HPI.  Otherwise, noncontributory.  GEN: nad, alert and oriented HEENT: mucous membranes moist, OP wnl NECK: supple w/o LA, no bruit noted.  CV: rrr. Not tachy PULM: ctab, no inc wob ABD: soft, +bs EXT: 1-2+ edema BLE, at baseline SKIN: no acute rash, chronic skin tags noted.

## 2014-04-08 NOTE — Assessment & Plan Note (Signed)
This could have been from relative dehydration.  She has cards f/u pending and that is reasonable.  No acute sx now, no more since leaving the ER.  Would check TSH given the palpitations.  D/w pt.  She agrees.  She'll notify us if sx return.  Okay for outpatient f/u.

## 2014-05-05 ENCOUNTER — Encounter: Payer: Self-pay | Admitting: Cardiovascular Disease

## 2014-05-06 ENCOUNTER — Ambulatory Visit (INDEPENDENT_AMBULATORY_CARE_PROVIDER_SITE_OTHER): Payer: Medicare Other | Admitting: Cardiovascular Disease

## 2014-05-06 ENCOUNTER — Encounter: Payer: Self-pay | Admitting: Cardiovascular Disease

## 2014-05-06 VITALS — BP 144/84 | HR 56 | Ht 62.0 in | Wt 169.4 lb

## 2014-05-06 DIAGNOSIS — R42 Dizziness and giddiness: Secondary | ICD-10-CM | POA: Diagnosis not present

## 2014-05-06 DIAGNOSIS — I10 Essential (primary) hypertension: Secondary | ICD-10-CM | POA: Diagnosis not present

## 2014-05-06 DIAGNOSIS — E785 Hyperlipidemia, unspecified: Secondary | ICD-10-CM

## 2014-05-06 NOTE — Progress Notes (Signed)
Patient ID: Kristen Goodman, female   DOB: March 12, 1935, 79 y.o.   MRN: [id number]   79 yo referred by Dr Damita Dunnings for preysyncope  palpitations and chest pain.  Seen in ER 04/06/14 and r/o no arrhythmia on telemetry d/c home ? Dehydration   Labs including TSH ok Troponin negative x 2    Patient reports she woke up about 9:15 11/30  morning which is her usual time to get up and she had a aching chest pain in the center of her chest. She denies nausea, vomiting, shortness of breath or diaphoresis. She states when she stood up she felt weak and felt like she was going to pass out. She states the discomfort lasted 5-10 minutes. She then had a second episode that lasted about the same. Also during these episodes her nephew states she was complaining of feeling like her heart was beating fast. She states her pain at its worse was a 5 out of 10, currently her pain is gone and is a 0 out of 10. Her nephew states she has anxiety and she has had chest pain before that they thought was from anxiety. She denies having any upsetting events in her life at this time.  No issues last month   ROS: Denies fever, malais, weight loss, blurry vision, decreased visual acuity, cough, sputum, SOB, hemoptysis, pleuritic pain, palpitaitons, heartburn, abdominal pain, melena, lower extremity edema, claudication, or rash.  All other systems reviewed and negative   General: Affect appropriate Elderly black female  HEENT: normal Neck supple with no adenopathy JVP normal no bruits no thyromegaly Lungs clear with no wheezing and good diaphragmatic motion Heart:  S1/S2 no murmur,rub, gallop or click PMI normal Abdomen: benighn, BS positve, no tenderness, no AAA no bruit.  No HSM or HJR Distal pulses intact with no bruits Plus one bilateral edema Neuro non-focal Skin warm and dry No muscular weakness  Medications Current Outpatient Prescriptions  Medication Sig Dispense Refill  . acetaminophen (TYLENOL) 500 MG  tablet Take 500 mg by mouth as needed.      Marland Kitchen amLODipine (NORVASC) 5 MG tablet TAKE 1 TABLET BY MOUTH EVERY DAY 30 tablet 11  . ARIPiprazole (ABILIFY) 5 MG tablet Take 0.5 tablets (2.5 mg total) by mouth daily. 45 tablet 1  . captopril (CAPOTEN) 50 MG tablet TAKE ONE BY MOUTH TWO TIMES A DAY 60 tablet 11  . Cholecalciferol 1000 UNITS capsule Take 1 capsule (1,000 Units total) by mouth daily.    . furosemide (LASIX) 40 MG tablet TAKE 1 TABLET BY MOUTH ONCE A DAY 30 tablet 12  . metoprolol (LOPRESSOR) 100 MG tablet TAKE 1 TABLET BY MOUTH TWICE A DAY 60 tablet 12  . rosuvastatin (CRESTOR) 5 MG tablet TAKE 1 TABLET (5 MG TOTAL) BY MOUTH DAILY. 30 tablet 12   No current facility-administered medications for this visit.    Allergies Risperdal  Family History: Family History  Problem Relation Age of Onset  . Hypertension Mother   . Diabetes Father   . Hypertension Father   . Heart disease Sister   . Diabetes Brother   . Cancer Neg Hx   . Alcohol abuse Neg Hx   . Depression Neg Hx   . Breast cancer Neg Hx   . Colon cancer Neg Hx   . Diabetes Brother     Social History: History   Social History  . Marital Status: Widowed    Spouse Name: N/A    Number of Children: N/A  .  Years of Education: N/A   Occupational History  . Not on file.   Social History Main Topics  . Smoking status: Never Smoker   . Smokeless tobacco: Never Used  . Alcohol Use: No  . Drug Use: No  . Sexual Activity: Not on file   Other Topics Concern  . Not on file   Social History Narrative   1 step daughter   Retired, prev at [employer], husband died 2008-08-13 after 50 years of marriage.     Lives in her home along with her godson.     Past Surgical History  Procedure Laterality Date  . Abdominal hysterectomy  07/04/1985    HRT s/p partial hyst for dysmenorrhea/ menopause    Past Medical History  Diagnosis Date  . Hypertension 05/08/1978  . Hyperlipidemia 03/09/1995  .  Diabetes mellitus 06/08/2001    type II  . Arthritis     knee  . Memory changes     with hallucinations    Electrocardiogram: 04/07/14  SR RBBB LAE no acute ST changes  Today  05/06/14   SR rate 56  LAD RBBB LVH    Assessment and Plan

## 2014-05-06 NOTE — Assessment & Plan Note (Signed)
Non recurrent last 30 days  Telemtry in ER normal no change in ECG  No evidence of cardiac etiology

## 2014-05-06 NOTE — Patient Instructions (Signed)
Your physician recommends that you schedule a follow-up appointment in: AS NEEDED  Your physician recommends that you continue on your current medications as directed. Please refer to the Current Medication list given to you today.  

## 2014-05-06 NOTE — Assessment & Plan Note (Signed)
Cholesterol is at goal.  Continue current dose of statin and diet Rx.  No myalgias or side effects.  F/U  LFT's in 6 months. Lab Results  Component Value Date   LDLCALC 45 05/27/2012

## 2014-05-06 NOTE — Assessment & Plan Note (Signed)
Well controlled.  Continue current medications and low sodium Dash type diet.    

## 2014-07-20 ENCOUNTER — Ambulatory Visit (INDEPENDENT_AMBULATORY_CARE_PROVIDER_SITE_OTHER): Payer: Medicare Other | Admitting: Family Medicine

## 2014-07-20 ENCOUNTER — Encounter: Payer: Self-pay | Admitting: Family Medicine

## 2014-07-20 VITALS — BP 126/70 | HR 61 | Temp 98.2°F | Wt 168.0 lb

## 2014-07-20 DIAGNOSIS — R443 Hallucinations, unspecified: Secondary | ICD-10-CM

## 2014-07-20 DIAGNOSIS — I10 Essential (primary) hypertension: Secondary | ICD-10-CM

## 2014-07-20 DIAGNOSIS — E785 Hyperlipidemia, unspecified: Secondary | ICD-10-CM

## 2014-07-20 DIAGNOSIS — R739 Hyperglycemia, unspecified: Secondary | ICD-10-CM | POA: Diagnosis not present

## 2014-07-20 LAB — COMPREHENSIVE METABOLIC PANEL
ALK PHOS: 42 U/L (ref 39–117)
ALT: 16 U/L (ref 0–35)
AST: 18 U/L (ref 0–37)
Albumin: 3.9 g/dL (ref 3.5–5.2)
BUN: 18 mg/dL (ref 6–23)
CO2: 31 mEq/L (ref 19–32)
Calcium: 9.5 mg/dL (ref 8.4–10.5)
Chloride: 109 mEq/L (ref 96–112)
Creatinine, Ser: 1.29 mg/dL — ABNORMAL HIGH (ref 0.40–1.20)
GFR: 51.45 mL/min — ABNORMAL LOW (ref >60.00)
Glucose, Bld: 83 mg/dL (ref 70–99)
POTASSIUM: 3.9 meq/L (ref 3.5–5.1)
SODIUM: 143 meq/L (ref 135–145)
Total Bilirubin: 1 mg/dL (ref 0.2–1.2)
Total Protein: 7 g/dL (ref 6.0–8.3)

## 2014-07-20 LAB — LIPASE: Lipase: 31 U/L (ref 11.0–59.0)

## 2014-07-20 LAB — HEMOGLOBIN A1C: Hgb A1c MFr Bld: 5.8 % (ref 4.6–6.5)

## 2014-07-20 NOTE — Patient Instructions (Addendum)
I would think about getting the second pneumonia shot (prevnar).  Go to the lab on the way out.  We'll contact you with your lab report. Take care.  Glad to see you.  Recheck in about 6 months, labs before a physical.   Try using over the counter hydrocortisone cream on your back when you have more itching.

## 2014-07-20 NOTE — Progress Notes (Signed)
Pre visit review using our clinic review tool, if applicable. No additional management support is needed unless otherwise documented below in the visit note.  Hypertension:    Using medication without problems or lightheadedness: rarely lightheaded, o/w no issues on meds Chest pain with exertion:no Edema:  At baseline, in BLE edema Short of breath:no  Elevated Cholesterol: Using medications without problems:yes Muscle aches: no Diet compliance: "I gained a little, eating too much."  I looked back and her weight is stable over the last few months.  D/w pt about healthy diet.  Exercise: limited, d/w pt.  Some walking.  D/w pt about fall cautions.   D/w pt about recheck A1c.    She has some family help- her god son lives with her.  Her niece comes in to help some at home. She has no hallucinations now.  Compliant with meds.    Meds, vitals, and allergies reviewed.   ROS: See HPI.  Otherwise, noncontributory.  GEN: nad, alert and oriented HEENT: mucous membranes moist NECK: supple w/o LA CV: rrr. PULM: ctab, no inc wob ABD: soft, +bs EXT: BLE edema noted, 2+ at baseline.  SKIN: no acute rash, SKs diffusely on back (itching per patient) Recheck pulse ox 99% RA

## 2014-07-21 ENCOUNTER — Other Ambulatory Visit (INDEPENDENT_AMBULATORY_CARE_PROVIDER_SITE_OTHER): Payer: Medicare Other

## 2014-07-21 DIAGNOSIS — E785 Hyperlipidemia, unspecified: Secondary | ICD-10-CM | POA: Diagnosis not present

## 2014-07-21 LAB — LIPID PANEL
CHOLESTEROL: 117 mg/dL (ref 0–200)
HDL: 58.7 mg/dL (ref >39.00)
LDL CALC: 46 mg/dL (ref 0–99)
NONHDL: 58.3
Total CHOL/HDL Ratio: 2
Triglycerides: 63 mg/dL (ref 0.0–149.0)
VLDL: 12.6 mg/dL (ref 0.0–40.0)

## 2014-07-21 NOTE — Assessment & Plan Note (Signed)
Okay to continue statin.

## 2014-07-21 NOTE — Assessment & Plan Note (Signed)
Recheck A1c today, see notes on labs.

## 2014-07-21 NOTE — Assessment & Plan Note (Signed)
Controlled, no sx, continue as is.

## 2014-07-21 NOTE — Assessment & Plan Note (Signed)
Controlled, continue as is.  See notes on labs.   

## 2014-08-07 ENCOUNTER — Other Ambulatory Visit: Payer: Self-pay | Admitting: Family Medicine

## 2014-09-07 ENCOUNTER — Other Ambulatory Visit: Payer: Self-pay | Admitting: Family Medicine

## 2014-11-02 ENCOUNTER — Telehealth: Payer: Self-pay | Admitting: Family Medicine

## 2014-11-02 NOTE — Telephone Encounter (Signed)
[facility] Patient Name: Kristen Goodman DOB: 10-Oct-1934 Initial Comment Caller states feeling dizzy Nurse Assessment Nurse: Mechele Dawley, RN, Amy Date/Time Eilene Ghazi Time): 11/02/2014 10:30:27 AM Confirm and document reason for call. If symptomatic, describe symptoms. ---CALLER STATES THAT SHE IS FEELING DIZZY. SHE DOES NOT KNOW WHAT IS GOING ON. SHE STATES THAT WHEN SHE GOT UP TO GO TO THE BR SHE WAS DIZZY. SHE STATES SHE IS NOT DIZZY ANYMORE. SHE HAS NOT HAD THIS HAPPEN BEFORE. SHE HAS NOT SEEN DR. Damita Dunnings IN A WHILE. NO RECENT SICKNESS. HEADACHE WITH THE DIZZINESS. NO CHEST PAIN. NO SOB. NO WEAKNESS OR NUMBNESS. NO ISSUES JUST SITTING THERE. Has the patient traveled out of the country within the last 30 days? ---Not Applicable Does the patient require triage? ---Yes Related visit to physician within the last 2 weeks? ---No Does the PT have any chronic conditions? (i.e. diabetes, asthma, etc.) ---Yes List chronic conditions. ---BP, DIURETIC, BORDERLINE Guidelines Guideline Title Affirmed Question Affirmed Notes Dizziness - Lightheadedness [1] MODERATE dizziness (e.g., interferes with normal activities) AND [2] has NOT been evaluated by physician for this (Exception: dizziness caused by heat exposure, sudden standing, or poor fluid intake) Final Disposition User See Physician within [facility], Therapist, sports, [place]

## 2014-11-02 NOTE — Telephone Encounter (Signed)
Patient notified as instructed by telephone and verbalized understanding. 

## 2014-11-02 NOTE — Telephone Encounter (Signed)
Pt has appt on 11/03/14 at 11:45 am to see Dr Damita Dunnings.

## 2014-11-02 NOTE — Telephone Encounter (Signed)
Would have her hold her captopril in the meantime. Thanks.

## 2014-11-03 ENCOUNTER — Encounter: Payer: Self-pay | Admitting: Family Medicine

## 2014-11-03 ENCOUNTER — Ambulatory Visit (INDEPENDENT_AMBULATORY_CARE_PROVIDER_SITE_OTHER): Payer: Medicare Other | Admitting: Family Medicine

## 2014-11-03 VITALS — BP 138/78 | HR 60 | Temp 98.5°F | Wt 168.5 lb

## 2014-11-03 DIAGNOSIS — H8110 Benign paroxysmal vertigo, unspecified ear: Secondary | ICD-10-CM

## 2014-11-03 MED ORDER — MECLIZINE HCL 12.5 MG PO TABS: 12.5000 mg | ORAL_TABLET | Freq: Three times a day (TID) | ORAL | Status: DC | PRN

## 2014-11-03 NOTE — Progress Notes (Signed)
Pre visit review using our clinic review tool, if applicable. No additional management support is needed unless otherwise documented below in the visit note.  Sx started yesterday.  Yesterday in the bed the room started spinning.  She didn't feel like passing out; the episode resolved but then came back mult times.  No CP, no SOB.  I didn't know if this was lightheaded from BP and she held her captopril in the meantime based on instruction.  Similar episode this AM, but not at bad.  It happens when she rolls over in the bed.  No focal motor changes.    Meds, vitals, and allergies reviewed.   ROS: See HPI.  Otherwise, noncontributory.  GEN: nad, alert and oriented HEENT: mucous membranes moist NECK: supple w/o LA CV: rrr. PULM: ctab, no inc wob ABD: soft, +bs EXT: BLE edema at baseline SKIN: no acute rash CN 2-12 wnl B, S/S wnl x4

## 2014-11-03 NOTE — Patient Instructions (Signed)
Veritgo.  This should gradually get better.   You can take meclizine for now.  12.5mg  3 times a day as needed for vertigo.   I would hold off the captopril for now, until you feel better.  You can restart it when you don't have more episodes.

## 2014-11-04 DIAGNOSIS — H8110 Benign paroxysmal vertigo, unspecified ear: Secondary | ICD-10-CM | POA: Insufficient documentation

## 2014-11-04 NOTE — Assessment & Plan Note (Signed)
Likely, would still hold captopril for now since BP still okay today and I don't want her BP running low.  Use meclizine prn in meantime.  Path/phys BPV d/w pt.  Okay for outpatient fu.  Should gradually resolve.

## 2014-11-24 ENCOUNTER — Ambulatory Visit (INDEPENDENT_AMBULATORY_CARE_PROVIDER_SITE_OTHER): Payer: Medicare Other | Admitting: Family Medicine

## 2014-11-24 ENCOUNTER — Encounter: Payer: Self-pay | Admitting: Family Medicine

## 2014-11-24 VITALS — BP 116/76 | HR 65 | Temp 97.6°F | Wt 166.0 lb

## 2014-11-24 DIAGNOSIS — R55 Syncope and collapse: Secondary | ICD-10-CM

## 2014-11-24 NOTE — Progress Notes (Signed)
Pre visit review using our clinic review tool, if applicable. No additional management support is needed unless otherwise documented below in the visit note. Patient really has no specific complaint other than she says she "feels woozy" but she doesn't seem herself.  Kristen Goodman, CMA  Sunday (2 days ago) she was getting up and out of bed, got to the bathroom and then fell before using the bathroom.  She likely had LOC, didn't recall the fall.  Woke up on the floor.  She called for help and family came to help her.  Unclear time down.  She still has some episodic intermittent vertigo sx.  Meclizine helps some.  No CP, SOB.  BLE edema baseline.  See occ has episodes of a fast heart rate, "but I thought that was from my nerves."  No known head injury, no pain now, not sore.    No other known h/o syncope.    D/w pt about a life alert or similar.  Family is checking on that.    Meds, vitals, and allergies reviewed.   ROS: See HPI.  Otherwise, noncontributory.  GEN: nad, alert and oriented HEENT: mucous membranes moist NECK: supple w/o LA CV: rrr. PULM: ctab, no inc wob ABD: soft, +bs EXT:  Edema in BLE at baseline- 1-2+ BLE SKIN: no acute rash CN 2-12 wnl B, S/S/DTR wnl x4, gait at baseline

## 2014-11-24 NOTE — Patient Instructions (Addendum)
Kristen Goodman will call about your referral. We'll contact you with your lab report. Stop the amlodipine for now.   Check to see if you are still taking captopril, metoprolol, and lasix- let me know.  Update me about your BP in a few days.

## 2014-11-25 ENCOUNTER — Ambulatory Visit (INDEPENDENT_AMBULATORY_CARE_PROVIDER_SITE_OTHER)
Admission: RE | Admit: 2014-11-25 | Discharge: 2014-11-25 | Disposition: A | Discharge status: Home / Self Care | Payer: Medicare Other | Source: Ambulatory Visit | Attending: Family Medicine | Admitting: Family Medicine

## 2014-11-25 DIAGNOSIS — R55 Syncope and collapse: Secondary | ICD-10-CM | POA: Diagnosis not present

## 2014-11-25 DIAGNOSIS — R51 Headache: Secondary | ICD-10-CM | POA: Diagnosis not present

## 2014-11-25 DIAGNOSIS — R42 Dizziness and giddiness: Secondary | ICD-10-CM | POA: Diagnosis not present

## 2014-11-25 LAB — COMPREHENSIVE METABOLIC PANEL
ALBUMIN: 4.3 g/dL (ref 3.5–5.2)
ALT: 20 U/L (ref 0–35)
AST: 21 U/L (ref 0–37)
Alkaline Phosphatase: 53 U/L (ref 39–117)
BUN: 13 mg/dL (ref 6–23)
CO2: 29 mEq/L (ref 19–32)
Calcium: 9.8 mg/dL (ref 8.4–10.5)
Chloride: 104 mEq/L (ref 96–112)
Creatinine, Ser: 1.12 mg/dL (ref 0.40–1.20)
GFR: 60.5 mL/min (ref >60.00)
GLUCOSE: 97 mg/dL (ref 70–99)
POTASSIUM: 4.1 meq/L (ref 3.5–5.1)
SODIUM: 141 meq/L (ref 135–145)
Total Bilirubin: 1.4 mg/dL — ABNORMAL HIGH (ref 0.2–1.2)
Total Protein: 7.9 g/dL (ref 6.0–8.3)

## 2014-11-25 LAB — CBC WITH DIFFERENTIAL/PLATELET
BASOS PCT: 0.5 % (ref 0.0–3.0)
Basophils Absolute: 0 10*3/uL (ref 0.0–0.1)
EOS ABS: 0.1 10*3/uL (ref 0.0–0.7)
Eosinophils Relative: 2.6 % (ref 0.0–5.0)
HEMATOCRIT: 42.9 % (ref 36.0–46.0)
Hemoglobin: 14.5 g/dL (ref 12.0–15.0)
LYMPHS PCT: 27.9 % (ref 12.0–46.0)
Lymphs Abs: 1.3 10*3/uL (ref 0.7–4.0)
MCHC: 33.7 g/dL (ref 30.0–36.0)
MCV: 84 fl (ref 78.0–100.0)
MONO ABS: 0.4 10*3/uL (ref 0.1–1.0)
MONOS PCT: 8 % (ref 3.0–12.0)
Neutro Abs: 2.9 10*3/uL (ref 1.4–7.7)
Neutrophils Relative %: 61 % (ref 43.0–77.0)
Platelets: 169 10*3/uL (ref 150.0–400.0)
RBC: 5.11 Mil/uL (ref 3.87–5.11)
RDW: 14.4 % (ref 11.5–15.5)
WBC: 4.8 10*3/uL (ref 4.0–10.5)

## 2014-11-25 NOTE — Assessment & Plan Note (Signed)
Unclear cause, EKG w/o acute changes compared to prev.  Will check head CT given the event and likely LOC per her hx.  Given the timeline, not emergent (ie okay not to send to ER at time of OV). She has been lightheaded so we stopped her amlodipine today.  She'll check her doses of other BP meds at home.  The vertigo sounds to be a different issue and unrelated.  This wasn't similar to the prev vertigo episodes.  >25 minutes spent in face to face time with patient, >50% spent in counselling or coordination of care.  She still appears okay for outpatient f/u- family and patient agree.  I tried to call her nephew after the OV but his VM wasn't set up and he didn't answer.  I'll try again later.

## 2014-11-27 ENCOUNTER — Telehealth: Payer: Self-pay | Admitting: Family Medicine

## 2014-11-27 NOTE — Telephone Encounter (Signed)
I would continue as is.  Continue to stay off her BP meds that were stopped (ie amlodipine).  She should still be on metoprolol and lasix.  She was going to check on the captopril, but I think that was also prev stopped. If still on captopril also, then continue.  If she has been off that also, then still stay off it and update med list.  Thanks.

## 2014-11-27 NOTE — Telephone Encounter (Signed)
Patient says she is improving, not back to normal but better.  Patient says you too her off of a couple of medications and she wants to know if she is to continue to stay off of them?  Please advise.

## 2014-11-27 NOTE — Telephone Encounter (Signed)
Pt called in with update on condition as per requested by Dr Damita Dunnings, [phone]

## 2014-11-27 NOTE — Telephone Encounter (Signed)
Patient has stopped the Amlodipine and also the Captopril.  Med List updated.

## 2014-12-02 NOTE — Telephone Encounter (Addendum)
Appointment scheduled 12/03/14 @ 3:00. Legrand Como stated that he lives with patient and will see if he can get his uncle to bring patient for the appointment.

## 2014-12-02 NOTE — Telephone Encounter (Signed)
Vangie pts daughter left v/m at 4:57 PM(DPR signed); since changing medication pt still has dizziness,can't stand, head hurts, pt states feels like something digging in her scalp. Kathaleen Maser wants to know if pt needs to be rechecked. Pt is not moving around a lot due to fear of falling. Vangie request cb(cb on 12/03/14 OK per Kathaleen Maser).

## 2014-12-02 NOTE — Telephone Encounter (Signed)
I'll see tomorrow.  Thanks.

## 2014-12-02 NOTE — Telephone Encounter (Signed)
We can always recheck her- can she get in tomorrow?.  What is her BP running?  Thanks.

## 2014-12-02 NOTE — Telephone Encounter (Signed)
Spoke to patient and nephew and was advised that they have not checked her BP. Patient is complaining of her head hurting and eyes have been red and watery.

## 2014-12-03 ENCOUNTER — Ambulatory Visit (INDEPENDENT_AMBULATORY_CARE_PROVIDER_SITE_OTHER): Payer: Medicare Other | Admitting: Family Medicine

## 2014-12-03 ENCOUNTER — Encounter: Payer: Self-pay | Admitting: Family Medicine

## 2014-12-03 VITALS — BP 118/74 | HR 59 | Temp 98.4°F | Wt 166.0 lb

## 2014-12-03 DIAGNOSIS — R42 Dizziness and giddiness: Secondary | ICD-10-CM

## 2014-12-03 MED ORDER — METOPROLOL TARTRATE 100 MG PO TABS: ORAL_TABLET | ORAL | Status: DC

## 2014-12-03 NOTE — Progress Notes (Signed)
Pre visit review using our clinic review tool, if applicable. No additional management support is needed unless otherwise documented below in the visit note.  Off ACE and CCB.  BP isn't much higher today, is still similar to the last OV.  She has had HA and some rhinorrhea, white.  "I stay cold" but no acute fevers or chills.  She has had some burning and irritation in the eyes B, in the last few months.  She is still getting lightheaded.    Meds, vitals, and allergies reviewed.   ROS: See HPI.  Otherwise, noncontributory.  GEN: nad, alert and oriented HEENT: mucous membranes moist, TM wnl. Nasal exam slightly stuffy, sinuses not ttp NECK: supple w/o LA CV: rrr. PULM: ctab, no inc wob ABD: soft, +bs EXT: Edema in BLE at baseline- 1-2+ BLE SKIN: no acute rash gait at baseline

## 2014-12-03 NOTE — Patient Instructions (Signed)
Cut the metoprolol in half- take a half pill twice a day.  Don't change your other meds.  Update me as needed.   It will likely take about 5 days for your BP to come up some.  Take care. Glad to see you.

## 2014-12-04 NOTE — Assessment & Plan Note (Signed)
The headaches appear to be a separate benign issue, possibly from nasal congestion.  We agreed to work on the lightheadedness first.  Will cut metoprolol back to 1/2 tab BID and she'll update me.   Still okay for outpatient f/u.  She asked me to call her family re: plan.  I did and left message.

## 2015-01-10 ENCOUNTER — Other Ambulatory Visit: Payer: Self-pay | Admitting: Family Medicine

## 2015-01-10 DIAGNOSIS — R739 Hyperglycemia, unspecified: Secondary | ICD-10-CM

## 2015-01-10 DIAGNOSIS — M109 Gout, unspecified: Secondary | ICD-10-CM

## 2015-01-15 ENCOUNTER — Other Ambulatory Visit: Payer: Medicare Other

## 2015-01-22 ENCOUNTER — Ambulatory Visit (INDEPENDENT_AMBULATORY_CARE_PROVIDER_SITE_OTHER): Payer: Medicare Other | Admitting: Family Medicine

## 2015-01-22 ENCOUNTER — Encounter: Payer: Self-pay | Admitting: Family Medicine

## 2015-01-22 VITALS — BP 134/74 | HR 81 | Temp 98.1°F | Ht 59.5 in | Wt 167.2 lb

## 2015-01-22 DIAGNOSIS — Z0000 Encounter for general adult medical examination without abnormal findings: Secondary | ICD-10-CM

## 2015-01-22 DIAGNOSIS — Z8739 Personal history of other diseases of the musculoskeletal system and connective tissue: Secondary | ICD-10-CM

## 2015-01-22 DIAGNOSIS — M545 Low back pain, unspecified: Secondary | ICD-10-CM

## 2015-01-22 DIAGNOSIS — R739 Hyperglycemia, unspecified: Secondary | ICD-10-CM | POA: Diagnosis not present

## 2015-01-22 DIAGNOSIS — R443 Hallucinations, unspecified: Secondary | ICD-10-CM

## 2015-01-22 DIAGNOSIS — E785 Hyperlipidemia, unspecified: Secondary | ICD-10-CM | POA: Diagnosis not present

## 2015-01-22 DIAGNOSIS — M109 Gout, unspecified: Secondary | ICD-10-CM

## 2015-01-22 DIAGNOSIS — Z1211 Encounter for screening for malignant neoplasm of colon: Secondary | ICD-10-CM

## 2015-01-22 DIAGNOSIS — Z23 Encounter for immunization: Secondary | ICD-10-CM | POA: Diagnosis not present

## 2015-01-22 DIAGNOSIS — Z7189 Other specified counseling: Secondary | ICD-10-CM

## 2015-01-22 DIAGNOSIS — I10 Essential (primary) hypertension: Secondary | ICD-10-CM

## 2015-01-22 DIAGNOSIS — Z8639 Personal history of other endocrine, nutritional and metabolic disease: Secondary | ICD-10-CM | POA: Diagnosis not present

## 2015-01-22 LAB — HEMOGLOBIN A1C: HEMOGLOBIN A1C: 5.5 % (ref 4.6–6.5)

## 2015-01-22 LAB — LIPID PANEL
CHOLESTEROL: 125 mg/dL (ref 0–200)
HDL: 55.9 mg/dL (ref >39.00)
LDL Cholesterol: 55 mg/dL (ref 0–99)
NONHDL: 69.23
Total CHOL/HDL Ratio: 2
Triglycerides: 70 mg/dL (ref 0.0–149.0)
VLDL: 14 mg/dL (ref 0.0–40.0)

## 2015-01-22 LAB — URIC ACID: Uric Acid, Serum: 6.6 mg/dL (ref 2.4–7.0)

## 2015-01-22 MED ORDER — ROSUVASTATIN CALCIUM 5 MG PO TABS: ORAL_TABLET | ORAL | Status: DC

## 2015-01-22 MED ORDER — FUROSEMIDE 40 MG PO TABS: ORAL_TABLET | ORAL | Status: DC

## 2015-01-22 NOTE — Patient Instructions (Addendum)
Call about an eye exam.   Let me know if you want to go for a bone density test.  We can set that up.  Check with your insurance to see if they will cover the shingles shot. I would get the pneumonia shot later on.  Go to the lab on the way out.  We'll contact you with your lab report. Take care.  Glad to see you.

## 2015-01-22 NOTE — Progress Notes (Signed)
Pre visit review using our clinic review tool, if applicable. No additional management support is needed unless otherwise documented below in the visit note.  I have personally reviewed the Medicare Annual Wellness questionnaire and have noted 1. The patient's medical and social history 2. Their use of alcohol, tobacco or illicit drugs 3. Their current medications and supplements 4. The patient's functional ability including ADL's, fall risks, home safety risks and hearing or visual             impairment. 5. Diet and physical activities 6. Evidence for depression or mood disorders  The patients weight, height, BMI have been recorded in the chart and visual acuity is per eye clinic.  I have made referrals, counseling and provided education to the patient based review of the above and I have provided the pt with a written personalized care plan for preventive services.  Provider list updated- see scanned forms.  Routine anticipatory guidance given to patient.  See health maintenance.  Flu 2016 Shingles encouraged, declined by patient.  PNA encouraged, declined by patient.  Tetanus 2010 D/w patient [id number] for colon cancer screening, including IFOB vs. colonoscopy.  Risks and benefits of both were discussed and patient voiced understanding.  Pt elects for: IFOB.  Breast cancer screening done fall 2015, d/w pt.  DXA d/w pt.  She'll consider.   Advance directive- d/w pt.  Encouraged he to consider.  She doesn't have a person formally designated.   Cognitive function addressed- see scanned forms- and if abnormal then additional documentation follows.   H/o hallucinations, resolved with abilify, no ADE on med.  Mood is good.  No worries or concerns from patient.  She is clearly improved on med.   She continues to have gait changes from her back pain.  No weakness, but short strides.  Fall cautions d/w pt.    BLE edema.  Still taking lasix with some partial improvement.  No recent changes.   No SOB, no CP.   Elevated Cholesterol: Using medications without problems:yes Muscle aches: no Diet compliance:yes Exercise:limited  H/o hyperglycemia, resolved by most recent A1c, see notes on labs.    She isn't lightheaded, no vertigo sx now.   PMH and SH reviewed  Meds, vitals, and allergies reviewed.   ROS: See HPI.  Otherwise negative.    GEN: nad, alert and oriented HEENT: mucous membranes moist NECK: supple w/o LA CV: rrr. PULM: ctab, no inc wob ABD: soft, +bs EXT: 1-2+ BLE edema SKIN: no acute rash Short strides B, due to back pain.  At baseline.

## 2015-01-24 ENCOUNTER — Encounter: Payer: Self-pay | Admitting: Family Medicine

## 2015-01-24 DIAGNOSIS — Z7189 Other specified counseling: Secondary | ICD-10-CM | POA: Insufficient documentation

## 2015-01-24 NOTE — Assessment & Plan Note (Signed)
See notes on A1c. ?

## 2015-01-24 NOTE — Assessment & Plan Note (Signed)
At baseline, fall cautions d/w pt.

## 2015-01-24 NOTE — Assessment & Plan Note (Signed)
Continue statin, see notes on labs. No ADE on med.  She agrees.

## 2015-01-24 NOTE — Assessment & Plan Note (Signed)
H/o gout, see notes on labs.

## 2015-01-24 NOTE — Assessment & Plan Note (Signed)
Flu 2016 Shingles encouraged, declined by patient.  PNA encouraged, declined by patient.  Tetanus 2010 D/w patient [id number] for colon cancer screening, including IFOB vs. colonoscopy.  Risks and benefits of both were discussed and patient voiced understanding.  Pt elects for: IFOB.  Breast cancer screening done fall 2015, d/w pt.  DXA d/w pt.  She'll consider.   Advance directive- d/w pt.  Encouraged he to consider.  She doesn't have a person formally designated.   Cognitive function addressed- see scanned forms- and if abnormal then additional documentation follows.

## 2015-01-24 NOTE — Assessment & Plan Note (Signed)
Controlled, would continue current med.  D/w pt.  She agrees.

## 2015-02-11 ENCOUNTER — Other Ambulatory Visit: Payer: Self-pay | Admitting: Family Medicine

## 2015-02-15 ENCOUNTER — Other Ambulatory Visit: Payer: Self-pay

## 2015-02-15 DIAGNOSIS — Z1231 Encounter for screening mammogram for malignant neoplasm of breast: Secondary | ICD-10-CM

## 2015-02-18 ENCOUNTER — Other Ambulatory Visit: Payer: Self-pay | Admitting: Family Medicine

## 2015-02-23 ENCOUNTER — Other Ambulatory Visit: Payer: Self-pay | Admitting: Family Medicine

## 2015-02-23 ENCOUNTER — Encounter: Payer: Self-pay | Admitting: Cardiology

## 2015-03-11 ENCOUNTER — Other Ambulatory Visit: Payer: Self-pay | Admitting: Family Medicine

## 2015-03-25 ENCOUNTER — Ambulatory Visit
Admission: RE | Admit: 2015-03-25 | Discharge: 2015-03-25 | Disposition: A | Discharge status: Home / Self Care | Payer: Medicare Other | Source: Ambulatory Visit

## 2015-03-25 DIAGNOSIS — Z1231 Encounter for screening mammogram for malignant neoplasm of breast: Secondary | ICD-10-CM

## 2015-03-29 ENCOUNTER — Encounter: Payer: Self-pay | Admitting: *Deleted

## 2015-04-14 DIAGNOSIS — Z961 Presence of intraocular lens: Secondary | ICD-10-CM | POA: Diagnosis not present

## 2015-04-27 ENCOUNTER — Ambulatory Visit (INDEPENDENT_AMBULATORY_CARE_PROVIDER_SITE_OTHER): Payer: Medicare Other | Admitting: Podiatry

## 2015-04-27 ENCOUNTER — Encounter: Payer: Self-pay | Admitting: Podiatry

## 2015-04-27 VITALS — BP 85/60 | HR 62 | Resp 18

## 2015-04-27 DIAGNOSIS — B351 Tinea unguium: Secondary | ICD-10-CM

## 2015-04-27 DIAGNOSIS — M79676 Pain in unspecified toe(s): Secondary | ICD-10-CM

## 2015-04-27 NOTE — Progress Notes (Signed)
   Subjective:    Patient ID: Kristen Goodman, female    DOB: [birth year], 80 y.o.   MRN: [id number]  HPI  80 year old female presents the office of concerns of thick, painful, elongated toenails particularly her left fourth toe nail which she cannot trim herself. Denies any drainage or pus or swelling around the toenail or any redness. No other complaints at this time.  Review of Systems  All other systems reviewed and are negative.      Objective:   Physical Exam General: AAO x3, NAD  Dermatological: Nails are hypertrophic, dystrophic, brittle, discolored, elongated digit left fourth toe. There is no swelling erythema or drainage or edema. There is tenderness on palpation of nails 1-5 bilaterally. No open lesions or pre-ulcerative lesions.  Vascular: Dorsalis Pedis artery and Posterior Tibial artery pedal pulses are 2/4 bilateral with immedate capillary fill time. Pedal hair growth present. There is no pain with calf compression, swelling, warmth, erythema.   Neruologic: Grossly intact via light touch bilateral. Vibratory intact via tuning fork bilateral. Protective threshold with Semmes Wienstein monofilament intact to all pedal sites bilateral. Patellar and Achilles deep tendon reflexes 2+ bilateral. No Babinski or clonus noted bilateral.   Musculoskeletal: No gross boney pedal deformities bilateral. No pain, crepitus, or limitation noted with foot and ankle range of motion bilateral. Muscular strength 5/5 in all groups tested bilateral.  Gait: Unassisted, Nonantalgic.      Assessment & Plan:  80 year old female with symptomatic onychomycosis -Treatment options discussed including all alternatives, risks, and complications -Nail sharply debrided 10 without, complications leading. -Discussed the importance of daily foot inspection. -Follow-up as needed at her request or sooner if any problems arise. In the meantime, encouraged to call the office with any questions, concerns,  change in symptoms.   Celesta Gentile, DPM

## 2015-06-22 ENCOUNTER — Other Ambulatory Visit: Payer: Self-pay | Admitting: Family Medicine

## 2015-06-22 NOTE — Telephone Encounter (Signed)
Electronic refill request. Last Filled:    45 tablet 1 02/23/2015  Last office visit:   01/22/15 CPE.  Please advise.

## 2015-06-23 NOTE — Telephone Encounter (Signed)
Sent. Thanks.   

## 2015-07-27 ENCOUNTER — Ambulatory Visit (INDEPENDENT_AMBULATORY_CARE_PROVIDER_SITE_OTHER): Payer: Medicare Other | Admitting: Podiatry

## 2015-07-27 ENCOUNTER — Encounter: Payer: Self-pay | Admitting: Podiatry

## 2015-07-27 DIAGNOSIS — M79676 Pain in unspecified toe(s): Secondary | ICD-10-CM | POA: Diagnosis not present

## 2015-07-27 DIAGNOSIS — B351 Tinea unguium: Secondary | ICD-10-CM

## 2015-07-27 NOTE — Progress Notes (Signed)
Patient ID: Kristen Goodman, female   DOB: 01/21/1935, 81 y.o.   MRN: [id number]  Subjective: 81 y.o. returns the office today for painful, elongated, thickened toenails which she cannot trim herself. Denies any redness or drainage around the nails. Denies any acute changes since last appointment and no new complaints today. Denies any systemic complaints such as fevers, chills, nausea, vomiting.   Objective: AAO 3, NAD DP/PT pulses palpable, CRT less than 3 seconds Chronic appearing swelling to the ankles. Reports no change.  Nails hypertrophic, dystrophic, elongated, brittle, discolored 10. There is tenderness overlying the nails 1-5 bilaterally. There is no surrounding erythema or drainage along the nail sites. No open lesions or pre-ulcerative lesions are identified. No other areas of tenderness bilateral lower extremities. No overlying edema, erythema, increased warmth. No pain with calf compression, swelling, warmth, erythema.  Assessment: Patient presents with symptomatic onychomycosis  Plan: -Treatment options including alternatives, risks, complications were discussed -Nails sharply debrided 10 without complication/bleeding. -Discussed daily foot inspection. If there are any changes, to call the office immediately.  -Follow-up in 3 months or sooner if any problems are to arise. In the meantime, encouraged to call the office with any questions, concerns, changes symptoms.  Celesta Gentile, DPM

## 2015-10-07 ENCOUNTER — Other Ambulatory Visit: Payer: Self-pay | Admitting: *Deleted

## 2015-10-07 MED ORDER — FUROSEMIDE 40 MG PO TABS: 40.0000 mg | ORAL_TABLET | Freq: Every day | ORAL | Status: DC

## 2015-10-28 ENCOUNTER — Ambulatory Visit: Payer: Medicare Other | Admitting: Podiatry

## 2015-11-04 ENCOUNTER — Ambulatory Visit (INDEPENDENT_AMBULATORY_CARE_PROVIDER_SITE_OTHER): Payer: Medicare Other | Admitting: Podiatry

## 2015-11-04 ENCOUNTER — Encounter: Payer: Self-pay | Admitting: Podiatry

## 2015-11-04 DIAGNOSIS — M79676 Pain in unspecified toe(s): Secondary | ICD-10-CM | POA: Diagnosis not present

## 2015-11-04 DIAGNOSIS — B351 Tinea unguium: Secondary | ICD-10-CM

## 2015-11-04 NOTE — Progress Notes (Signed)
Patient ID: Kristen Goodman, female   DOB: 01/21/1935, 81 y.o.   MRN: [id number]  Subjective: 81 y.o. returns the office today for painful, elongated, thickened toenails which she cannot trim herself. Denies any redness or drainage around the nails. Denies any acute changes since last appointment and no new complaints today. Denies any systemic complaints such as fevers, chills, nausea, vomiting.   Objective: AAO 3, NAD DP/PT pulses palpable, CRT less than 3 seconds Chronic appearing swelling to the ankles. Reports no change.  Nails hypertrophic, dystrophic, elongated, brittle, discolored 10. There is tenderness overlying the nails 1-5 bilaterally. There is no surrounding erythema or drainage along the nail sites. No open lesions or pre-ulcerative lesions are identified. No other areas of tenderness bilateral lower extremities. No overlying edema, erythema, increased warmth. No pain with calf compression, swelling, warmth, erythema.  Assessment: Patient presents with symptomatic onychomycosis  Plan: -Treatment options including alternatives, risks, complications were discussed -Nails sharply debrided 10 without complication/bleeding. -Discussed daily foot inspection. If there are any changes, to call the office immediately.  -Follow-up in 3 months or sooner if any problems are to arise. In the meantime, encouraged to call the office with any questions, concerns, changes symptoms.  Celesta Gentile, DPM

## 2015-12-22 ENCOUNTER — Other Ambulatory Visit: Payer: Self-pay | Admitting: Family Medicine

## 2015-12-22 NOTE — Telephone Encounter (Signed)
Last refill 06/23/15 #45/1 Last office visit 01/22/15

## 2015-12-23 NOTE — Telephone Encounter (Signed)
Has upcoming OV scheduled.  Sent. Thanks.

## 2016-02-24 ENCOUNTER — Other Ambulatory Visit: Payer: Self-pay | Admitting: Family Medicine

## 2016-03-07 ENCOUNTER — Ambulatory Visit (INDEPENDENT_AMBULATORY_CARE_PROVIDER_SITE_OTHER): Payer: Medicare Other

## 2016-03-07 ENCOUNTER — Other Ambulatory Visit: Payer: Self-pay | Admitting: Family Medicine

## 2016-03-07 VITALS — BP 120/70 | HR 52 | Temp 97.4°F | Ht 60.0 in | Wt 171.5 lb

## 2016-03-07 DIAGNOSIS — Z0000 Encounter for general adult medical examination without abnormal findings: Secondary | ICD-10-CM | POA: Diagnosis not present

## 2016-03-07 DIAGNOSIS — E785 Hyperlipidemia, unspecified: Secondary | ICD-10-CM

## 2016-03-07 DIAGNOSIS — E559 Vitamin D deficiency, unspecified: Secondary | ICD-10-CM

## 2016-03-07 DIAGNOSIS — Z23 Encounter for immunization: Secondary | ICD-10-CM

## 2016-03-07 LAB — LIPID PANEL
CHOL/HDL RATIO: 2
CHOLESTEROL: 124 mg/dL (ref 0–200)
HDL: 52.7 mg/dL (ref >39.00)
LDL Cholesterol: 49 mg/dL (ref 0–99)
NonHDL: 71.67
TRIGLYCERIDES: 111 mg/dL (ref 0.0–149.0)
VLDL: 22.2 mg/dL (ref 0.0–40.0)

## 2016-03-07 LAB — COMPREHENSIVE METABOLIC PANEL
ALBUMIN: 3.9 g/dL (ref 3.5–5.2)
ALK PHOS: 46 U/L (ref 39–117)
ALT: 14 U/L (ref 0–35)
AST: 15 U/L (ref 0–37)
BILIRUBIN TOTAL: 1.1 mg/dL (ref 0.2–1.2)
BUN: 16 mg/dL (ref 6–23)
CALCIUM: 9.4 mg/dL (ref 8.4–10.5)
CO2: 27 mEq/L (ref 19–32)
CREATININE: 1.1 mg/dL (ref 0.40–1.20)
Chloride: 110 mEq/L (ref 96–112)
GFR: 61.57 mL/min (ref >60.00)
Glucose, Bld: 158 mg/dL — ABNORMAL HIGH (ref 70–99)
Potassium: 3.4 mEq/L — ABNORMAL LOW (ref 3.5–5.1)
Sodium: 146 mEq/L — ABNORMAL HIGH (ref 135–145)
TOTAL PROTEIN: 7.2 g/dL (ref 6.0–8.3)

## 2016-03-07 LAB — VITAMIN D 25 HYDROXY (VIT D DEFICIENCY, FRACTURES): VITD: 16.84 ng/mL — ABNORMAL LOW (ref 30.00–100.00)

## 2016-03-07 NOTE — Progress Notes (Signed)
PCP notes:   Health maintenance:  Flu vaccine - administered PCV13 - administered Shingles - declined due to cost Bone density- declined  Abnormal screenings:   Hearing -failed Mini-Cog score: 18/20  Patient concerns:   None  Nurse concerns:  None  Next PCP appt:   03/14/16 @ 0945 I reviewed health advisor's note, was available for consultation on the day of service listed in this note, and agree with documentation and plan. Elsie Stain, MD.

## 2016-03-07 NOTE — Progress Notes (Signed)
Pre visit review using our clinic review tool, if applicable. No additional management support is needed unless otherwise documented below in the visit note. 

## 2016-03-07 NOTE — Progress Notes (Signed)
Subjective:   Kristen Goodman is a 81 y.o. female who presents for Medicare Annual (Subsequent) preventive examination.  Review of Systems:  N/A Cardiac Risk Factors include: sedentary lifestyle;advanced age (>22men, >16 women);obesity (BMI >30kg/m2);dyslipidemia;hypertension     Objective:     Vitals: BP 120/70 (BP Location: Right Arm, Patient Position: Sitting, Cuff Size: Normal)   Pulse (!) 52   Temp 97.4 F (36.3 C) (Oral)   Ht 5' (1.524 m) Comment: no shoes  Wt 171 lb 8 oz (77.8 kg)   BMI 33.49 kg/m   Body mass index is 33.49 kg/m.   Tobacco History  Smoking Status  . Never Smoker  Smokeless Tobacco  . Never Used     Counseling given: No   Past Medical History:  Diagnosis Date  . Arthritis    knee  . Hyperlipidemia 03/09/1995  . Hypertension 05/08/1978  . Memory changes    with hallucinations   Past Surgical History:  Procedure Laterality Date  . ABDOMINAL HYSTERECTOMY  07/04/1985   HRT s/p partial hyst for dysmenorrhea/ menopause  . CATARACT EXTRACTION Bilateral    Family History  Problem Relation Age of Onset  . Hypertension Mother   . Diabetes Father   . Hypertension Father   . Heart disease Sister   . Diabetes Brother   . Diabetes Brother   . Cancer Neg Hx   . Alcohol abuse Neg Hx   . Depression Neg Hx   . Breast cancer Neg Hx   . Colon cancer Neg Hx    History  Sexual Activity  . Sexual activity: No    Outpatient Encounter Prescriptions as of 03/07/2016  Medication Sig  . acetaminophen (TYLENOL) 500 MG tablet Take 500 mg by mouth as needed.    . ARIPiprazole (ABILIFY) 5 MG tablet TAKE 1/2 TABLET BY MOUTH DAILY.  Marland Kitchen Cholecalciferol 1000 UNITS capsule Take 1 capsule (1,000 Units total) by mouth daily.  . furosemide (LASIX) 40 MG tablet Take 1 tablet (40 mg total) by mouth daily.  . metoprolol (LOPRESSOR) 100 MG tablet TAKE 1/2 TABLET BY MOUTH TWICE A DAY (Patient taking differently: Take 50 mg by mouth every morning. TAKE 1/2  TABLET BY MOUTH TWICE A DAY)  . metoprolol (LOPRESSOR) 100 MG tablet TAKE 1 TABLET BY MOUTH TWICE A DAY (Patient taking differently: TAKE 1 TABLET BY MOUTH AT BEDTIME)  . rosuvastatin (CRESTOR) 5 MG tablet TAKE 1 TABLET (5 MG TOTAL) BY MOUTH DAILY.  . meclizine (ANTIVERT) 12.5 MG tablet Take 1 tablet (12.5 mg total) by mouth 3 (three) times daily as needed for dizziness. (Patient not taking: Reported on 03/07/2016)  . [DISCONTINUED] rosuvastatin (CRESTOR) 5 MG tablet TAKE 1 TABLET (5 MG TOTAL) BY MOUTH DAILY.   No facility-administered encounter medications on file as of 03/07/2016.     Activities of Daily Living In your present state of health, do you have any difficulty performing the following activities: 03/07/2016  Hearing? Y  Vision? N  Difficulty concentrating or making decisions? Y  Walking or climbing stairs? Y  Dressing or bathing? N  Doing errands, shopping? Y  Preparing Food and eating ? N  Using the Toilet? N  In the past six months, have you accidently leaked urine? Y  Do you have problems with loss of bowel control? N  Managing your Medications? N  Managing your Finances? N  Housekeeping or managing your Housekeeping? N  Some recent data might be hidden    Patient Care Team: Tonia Ghent,  MD as PCP - General (Family Medicine)    Assessment:     Hearing Screening   125Hz  250Hz  500Hz  1000Hz  2000Hz  3000Hz  4000Hz  6000Hz  8000Hz   Right ear:   40 40 40  40    Left ear:   0 40 40  40      Visual Acuity Screening   Right eye Left eye Both eyes  Without correction: 20/70 20/200 20/20  With correction:       Exercise Activities and Dietary recommendations Current Exercise Habits: The patient does not participate in regular exercise at present, Exercise limited by: None identified  Goals    . Increase water intake          Starting 03/07/2016, I will attempt to drink at least 6-8 glasses of water daily.       Fall Risk Fall Risk  03/07/2016 05/31/2012    Falls in the past year? No No   Depression Screen PHQ 2/9 Scores 03/07/2016 05/31/2012  PHQ - 2 Score 0 2     Cognitive Function MMSE - Mini Mental State Exam 03/07/2016  Orientation to time 5  Orientation to Place 5  Registration 3  Attention/ Calculation 0  Recall 3  Language- name 2 objects 0  Language- repeat 1  Language- follow 3 step command 1  Language- follow 3 step command-comments pt was unable to follow 2 steps of 3 step command  Language- read & follow direction 0  Write a sentence 0  Copy design 0  Total score 18       PLEASE NOTE: A Mini-Cog screen was completed. Maximum score is 20. A value of 0 denotes this part of Folstein MMSE was not completed or the patient failed this part of the Mini-Cog screening.   Mini-Cog Screening Orientation to Time - Max 5 pts Orientation to Place - Max 5 pts Registration - Max 3 pts Recall - Max 3 pts Language Repeat - Max 1 pts Language Follow 3 Step Command - Max 3 pts   Immunization History  Administered Date(s) Administered  . Influenza Whole 02/20/2006, 02/28/2007, 03/04/2008, 03/10/2009, 02/28/2010  . Influenza,inj,Quad PF,36+ Mos 05/30/2013, 01/19/2014, 01/22/2015, 03/07/2016  . Pneumococcal Conjugate-13 03/07/2016  . Pneumococcal Polysaccharide-23 01/07/2004  . Td 01/07/1999, 03/10/2009   Screening Tests Health Maintenance  Topic Date Due  . DEXA SCAN  03/07/2026 (Originally 11/13/2001)  . ZOSTAVAX  03/07/2026 (Originally 11/13/1996)  . TETANUS/TDAP  03/11/2019  . INFLUENZA VACCINE  Completed  . PNA vac Low Risk Adult  Completed      Plan:     I have personally reviewed and addressed the Medicare Annual Wellness questionnaire and have noted the following in the patient's chart:  A. Medical and social history B. Use of alcohol, tobacco or illicit drugs  C. Current medications and supplements D. Functional ability and status E.  Nutritional status F.  Physical activity G. Advance directives H. List of  other physicians I.  Hospitalizations, surgeries, and ER visits in previous 12 months J.  [place] to include hearing, vision, cognitive, depression L. Referrals and appointments - none  In addition, I have reviewed and discussed with patient certain preventive protocols, quality metrics, and best practice recommendations. A written personalized care plan for preventive services as well as general preventive health recommendations were provided to patient.  See attached scanned questionnaire for additional information.   Signed,   Lindell Noe, MHA, BS, LPN Health Coach

## 2016-03-07 NOTE — Patient Instructions (Signed)
Kristen Goodman , Thank you for taking time to come for your Medicare Wellness Visit. I appreciate your ongoing commitment to your health goals. Please review the following plan we discussed and let me know if I can assist you in the future.   These are the goals we discussed: Goals    . Increase water intake          Starting 03/07/2016, I will attempt to drink at least 6-8 glasses of water daily.        This is a list of the screening recommended for you and due dates:  Health Maintenance  Topic Date Due  . DEXA scan (bone density measurement)  03/07/2026*  . Shingles Vaccine  03/07/2026*  . Tetanus Vaccine  03/11/2019  . Flu Shot  Completed  . Pneumonia vaccines  Completed  *Topic was postponed. The date shown is not the original due date.   Preventive Care for Adults  A healthy lifestyle and preventive care can promote health and wellness. Preventive health guidelines for adults include the following key practices.  . A routine yearly physical is a good way to check with your health care provider about your health and preventive screening. It is a chance to share any concerns and updates on your health and to receive a thorough exam.  . Visit your dentist for a routine exam and preventive care every 6 months. Brush your teeth twice a day and floss once a day. Good oral hygiene prevents tooth decay and gum disease.  . The frequency of eye exams is based on your age, health, family medical history, use  of contact lenses, and other factors. Follow your health care provider's ecommendations for frequency of eye exams.  . Eat a healthy diet. Foods like vegetables, fruits, whole grains, low-fat dairy products, and lean protein foods contain the nutrients you need without too many calories. Decrease your intake of foods high in solid fats, added sugars, and salt. Eat the right amount of calories for you. Get information about a proper diet from your health care provider, if  necessary.  . Regular physical exercise is one of the most important things you can do for your health. Most adults should get at least 150 minutes of moderate-intensity exercise (any activity that increases your heart rate and causes you to sweat) each week. In addition, most adults need muscle-strengthening exercises on 2 or more days a week.  Silver Sneakers may be a benefit available to you. To determine eligibility, you may visit the website: www.silversneakers.com or contact program at [phone]) [phone] Mon-Fri between 8AM-8PM.   . Maintain a healthy weight. The body mass index (BMI) is a screening tool to identify possible weight problems. It provides an estimate of body fat based on height and weight. Your health care provider can find your BMI and can help you achieve or maintain a healthy weight.   For adults 20 years and older: ? A BMI below 18.5 is considered underweight. ? A BMI of 18.5 to 24.9 is normal. ? A BMI of 25 to 29.9 is considered overweight. ? A BMI of 30 and above is considered obese.   . Maintain normal blood lipids and cholesterol levels by exercising and minimizing your intake of saturated fat. Eat a balanced diet with plenty of fruit and vegetables. Blood tests for lipids and cholesterol should begin at age 3 and be repeated every 5 years. If your lipid or cholesterol levels are high, you are over 50, or you are at  high risk for heart disease, you may need your cholesterol levels checked more frequently. Ongoing high lipid and cholesterol levels should be treated with medicines if diet and exercise are not working.  . If you smoke, find out from your health care provider how to quit. If you do not use tobacco, please do not start.  . If you choose to drink alcohol, please do not consume more than 2 drinks per day. One drink is considered to be 12 ounces (355 mL) of beer, 5 ounces (148 mL) of wine, or 1.5 ounces (44 mL) of liquor.  . If you are 37-25 years old, ask your  health care provider if you should take aspirin to prevent strokes.  . Use sunscreen. Apply sunscreen liberally and repeatedly throughout the day. You should seek shade when your shadow is shorter than you. Protect yourself by wearing long sleeves, pants, a wide-brimmed hat, and sunglasses year round, whenever you are outdoors.  . Once a month, do a whole body skin exam, using a mirror to look at the skin on your back. Tell your health care provider of new moles, moles that have irregular borders, moles that are larger than a pencil eraser, or moles that have changed in shape or color.

## 2016-03-14 ENCOUNTER — Ambulatory Visit (INDEPENDENT_AMBULATORY_CARE_PROVIDER_SITE_OTHER): Payer: Medicare Other | Admitting: Family Medicine

## 2016-03-14 ENCOUNTER — Encounter: Payer: Self-pay | Admitting: Family Medicine

## 2016-03-14 VITALS — BP 136/80 | HR 59 | Temp 97.7°F | Ht 60.0 in | Wt 170.0 lb

## 2016-03-14 DIAGNOSIS — E559 Vitamin D deficiency, unspecified: Secondary | ICD-10-CM

## 2016-03-14 DIAGNOSIS — R7989 Other specified abnormal findings of blood chemistry: Secondary | ICD-10-CM

## 2016-03-14 DIAGNOSIS — R739 Hyperglycemia, unspecified: Secondary | ICD-10-CM | POA: Diagnosis not present

## 2016-03-14 DIAGNOSIS — R443 Hallucinations, unspecified: Secondary | ICD-10-CM | POA: Diagnosis not present

## 2016-03-14 DIAGNOSIS — I10 Essential (primary) hypertension: Secondary | ICD-10-CM

## 2016-03-14 DIAGNOSIS — Z0000 Encounter for general adult medical examination without abnormal findings: Secondary | ICD-10-CM

## 2016-03-14 DIAGNOSIS — R413 Other amnesia: Secondary | ICD-10-CM

## 2016-03-14 DIAGNOSIS — E785 Hyperlipidemia, unspecified: Secondary | ICD-10-CM

## 2016-03-14 MED ORDER — ROSUVASTATIN CALCIUM 5 MG PO TABS: ORAL_TABLET | ORAL | 3 refills | Status: DC

## 2016-03-14 MED ORDER — METOPROLOL TARTRATE 100 MG PO TABS: ORAL_TABLET | ORAL | Status: DC

## 2016-03-14 MED ORDER — CHOLECALCIFEROL 50 MCG (2000 UT) PO TABS: 1.0000 | ORAL_TABLET | Freq: Every day | ORAL | Status: AC

## 2016-03-14 NOTE — Progress Notes (Signed)
Pre visit review using our clinic review tool, if applicable. No additional management support is needed unless otherwise documented below in the visit note. 

## 2016-03-14 NOTE — Patient Instructions (Addendum)
Reasonable to call about a mammogram when convenient.   You can call for a mammogram at the [facility] taking vitamin D.  Take 2000 units a day.  You can get that over the counter.  Take care.  Glad to see you.  Recheck labs in about 3 months.  You don't need to fast.  Update me as needed.

## 2016-03-14 NOTE — Progress Notes (Signed)
Hearing screening d/w pt.  She declined hearing aids.    Memory testing.  She has occ lapses of memory but then will recall the needed details.   She knows year, month and day.   3/3 attention.   Can do basic math.   3/3 recall.   Can read a watch.   She has stopped driving but that is at baseline.  She is still living with her godson at baseline, still mainly independent o/w.  She'll monitor her situation for now.  She doesn't have red flag/unsafe events.    Advance directive- d/w pt.  Encouraged he to consider.  Mammogram d/w pt.  See AVS.  DXA declined.  D/w pt.   Colon CA screening declined.   Hypertension:    Using medication without problems or lightheadedness: yes Chest pain with exertion:no Edema:at baseline Short of breath:no Labs d/w pt.  Still wearing OTC hose.  Edema some better when feet elevated  Elevated Cholesterol: Using medications without problems:yes Muscle aches: no Diet compliance: encouraged. "I need to close my mouth a little." d/w pt about healthy diet.   Exercise: d/w pt about walking as tolerated.  Short gait/stride at baseline.  No falls. D/w pt about cautions.  Gait changes predate abilify use, not worse in the meantime.    Hyperglycemia.  Prev labs were not fasting.   D/w pt about f/u testing.    Low vit D.  Not currently on replacement.  D/w pt about tx and f/u testing.    H/o hallucinations. Resolved now with abilify use.  Trouble getting to sleep with some waking at night.  She eventually gets back to sleep.  This is longstanding.  She puts up with the insomnia.  Occ daytime napping, d/w pt about trying to limit daytime naps.  She isn't anxious or worried, no hallucinations.    PMH and SH reviewed  ROS: Per HPI unless specifically indicated in ROS section   Meds, vitals, and allergies reviewed.   GEN: nad, alert and oriented HEENT: mucous membranes moist NECK: supple w/o LA CV: rrr. PULM: ctab, no inc wob ABD: soft, +bs EXT: chronic BLE  edema noted, at baseline.  SKIN: no acute rash Short gait, at baseline.   Speech and affect wnl.  Pleasant in conversation

## 2016-03-15 DIAGNOSIS — Z0000 Encounter for general adult medical examination without abnormal findings: Secondary | ICD-10-CM | POA: Insufficient documentation

## 2016-03-15 NOTE — Assessment & Plan Note (Addendum)
Controlled with current medication. No adverse effect on medication. At this point the benefit of the medication very much likely outweighs the risk. Continue as is. She agrees. >25 minutes spent in face to face time with patient, >50% spent in counselling or coordination of care

## 2016-03-15 NOTE — Assessment & Plan Note (Signed)
Work on diet and recheck A1c with next set of labs. She agrees.

## 2016-03-15 NOTE — Assessment & Plan Note (Signed)
Recheck blood pressure was improved, at goal. Continue current medications. Elevate feet as needed for edema. This is long-standing. Discussed with patient about diet and exercise as tolerated.

## 2016-03-15 NOTE — Assessment & Plan Note (Signed)
Restart vitamin D replacement and recheck in about 3 months. She agrees.

## 2016-03-15 NOTE — Assessment & Plan Note (Signed)
Lipids controlled. Continue statin. Discussed with patient about diet and exercise as tolerated. She agrees.

## 2016-03-15 NOTE — Assessment & Plan Note (Signed)
No emergent symptoms or events. She is going to monitor her memory in the meantime. This is reasonable. She had normal basic testing today.

## 2016-03-15 NOTE — Assessment & Plan Note (Signed)
Advance directive- d/w pt.  Encouraged he to consider.  Mammogram d/w pt.  See AVS.  DXA declined.  D/w pt.   Colon CA screening declined.

## 2016-05-10 ENCOUNTER — Other Ambulatory Visit: Payer: Self-pay | Admitting: Family Medicine

## 2016-05-10 NOTE — Telephone Encounter (Signed)
Received refill request electronically See last office notes. Please confirm that patient is to take Metoprolol twice a day.

## 2016-05-11 NOTE — Telephone Encounter (Signed)
Would continue as is with 1/2 tab BID unless BP sig elevated or decreased.  Thanks.  Sent.

## 2016-06-08 ENCOUNTER — Other Ambulatory Visit: Payer: Self-pay | Admitting: Family Medicine

## 2016-06-08 NOTE — Telephone Encounter (Signed)
Sent. Thanks.   

## 2016-06-08 NOTE — Telephone Encounter (Signed)
Received refill electronically Last refill 12/23/15 #45/1 Last office visit 03/14/16

## 2016-06-09 DIAGNOSIS — H01004 Unspecified blepharitis left upper eyelid: Secondary | ICD-10-CM | POA: Diagnosis not present

## 2016-06-09 DIAGNOSIS — H524 Presbyopia: Secondary | ICD-10-CM | POA: Diagnosis not present

## 2016-06-14 ENCOUNTER — Other Ambulatory Visit (INDEPENDENT_AMBULATORY_CARE_PROVIDER_SITE_OTHER): Payer: Medicare Other

## 2016-06-14 DIAGNOSIS — E559 Vitamin D deficiency, unspecified: Secondary | ICD-10-CM | POA: Diagnosis not present

## 2016-06-14 DIAGNOSIS — R739 Hyperglycemia, unspecified: Secondary | ICD-10-CM | POA: Diagnosis not present

## 2016-06-14 DIAGNOSIS — R7989 Other specified abnormal findings of blood chemistry: Secondary | ICD-10-CM

## 2016-06-14 LAB — VITAMIN D 25 HYDROXY (VIT D DEFICIENCY, FRACTURES): VITD: 35.06 ng/mL (ref 30.00–100.00)

## 2016-06-14 LAB — HEMOGLOBIN A1C: HEMOGLOBIN A1C: 5.6 % (ref 4.6–6.5)

## 2016-10-24 ENCOUNTER — Other Ambulatory Visit: Payer: Self-pay | Admitting: Family Medicine

## 2016-12-14 ENCOUNTER — Other Ambulatory Visit: Payer: Self-pay | Admitting: Family Medicine

## 2016-12-14 NOTE — Telephone Encounter (Signed)
Last filled 10/2015 #90 with 2 refills... Please advise if okay for pt to continue

## 2016-12-15 NOTE — Telephone Encounter (Signed)
Sent. Thanks.  Due for annual Medicare wellness visit this fall.

## 2017-03-12 ENCOUNTER — Other Ambulatory Visit: Payer: Self-pay | Admitting: Family Medicine

## 2017-03-12 ENCOUNTER — Ambulatory Visit (INDEPENDENT_AMBULATORY_CARE_PROVIDER_SITE_OTHER): Payer: Medicare Other

## 2017-03-12 VITALS — BP 132/78 | HR 55 | Temp 97.6°F | Ht 60.0 in | Wt 172.5 lb

## 2017-03-12 DIAGNOSIS — R739 Hyperglycemia, unspecified: Secondary | ICD-10-CM | POA: Diagnosis not present

## 2017-03-12 DIAGNOSIS — I10 Essential (primary) hypertension: Secondary | ICD-10-CM

## 2017-03-12 DIAGNOSIS — Z0000 Encounter for general adult medical examination without abnormal findings: Secondary | ICD-10-CM | POA: Diagnosis not present

## 2017-03-12 DIAGNOSIS — E559 Vitamin D deficiency, unspecified: Secondary | ICD-10-CM | POA: Diagnosis not present

## 2017-03-12 DIAGNOSIS — Z23 Encounter for immunization: Secondary | ICD-10-CM | POA: Diagnosis not present

## 2017-03-12 LAB — COMPREHENSIVE METABOLIC PANEL WITH GFR
ALT: 15 U/L (ref 0–35)
AST: 15 U/L (ref 0–37)
Albumin: 4 g/dL (ref 3.5–5.2)
Alkaline Phosphatase: 43 U/L (ref 39–117)
BUN: 15 mg/dL (ref 6–23)
CO2: 26 meq/L (ref 19–32)
Calcium: 9.9 mg/dL (ref 8.4–10.5)
Chloride: 109 meq/L (ref 96–112)
Creatinine, Ser: 1.18 mg/dL (ref 0.40–1.20)
GFR: 56.63 mL/min — ABNORMAL LOW
Glucose, Bld: 88 mg/dL (ref 70–99)
Potassium: 3.9 meq/L (ref 3.5–5.1)
Sodium: 143 meq/L (ref 135–145)
Total Bilirubin: 1.3 mg/dL — ABNORMAL HIGH (ref 0.2–1.2)
Total Protein: 7.1 g/dL (ref 6.0–8.3)

## 2017-03-12 LAB — LIPID PANEL
Cholesterol: 123 mg/dL (ref 0–200)
HDL: 57.1 mg/dL (ref >39.00)
LDL Cholesterol: 52 mg/dL (ref 0–99)
NonHDL: 65.69
TRIGLYCERIDES: 69 mg/dL (ref 0.0–149.0)
Total CHOL/HDL Ratio: 2
VLDL: 13.8 mg/dL (ref 0.0–40.0)

## 2017-03-12 LAB — HEMOGLOBIN A1C: HEMOGLOBIN A1C: 5.5 % (ref 4.6–6.5)

## 2017-03-12 LAB — VITAMIN D 25 HYDROXY (VIT D DEFICIENCY, FRACTURES): VITD: 43.85 ng/mL (ref 30.00–100.00)

## 2017-03-12 NOTE — Progress Notes (Signed)
Subjective:   Kristen Goodman is a 82 y.o. female who presents for Medicare Annual (Subsequent) preventive examination.  Review of Systems:  N/A Cardiac Risk Factors include: advanced age (>82men, >26 women);dyslipidemia;hypertension;obesity (BMI >30kg/m2)     Objective:     Vitals: BP 132/78 (BP Location: Right Arm, Patient Position: Sitting, Cuff Size: Normal)   Pulse (!) 55   Temp 97.6 F (36.4 C) (Oral)   Ht 5' (1.524 m) Comment: shoes  Wt 172 lb 8 oz (78.2 kg)   SpO2 98%   BMI 33.69 kg/m   Body mass index is 33.69 kg/m.   Tobacco Social History   Tobacco Use  Smoking Status Never Smoker  Smokeless Tobacco Never Used     Counseling given: No   Past Medical History:  Diagnosis Date  . Arthritis    knee  . Hyperlipidemia 03/09/1995  . Hypertension 05/08/1978  . Memory changes    with hallucinations   Past Surgical History:  Procedure Laterality Date  . ABDOMINAL HYSTERECTOMY  07/04/1985   HRT s/p partial hyst for dysmenorrhea/ menopause  . CATARACT EXTRACTION Bilateral    Family History  Problem Relation Age of Onset  . Hypertension Mother   . Diabetes Father   . Hypertension Father   . Heart disease Sister   . Diabetes Brother   . Diabetes Brother   . Cancer Neg Hx   . Alcohol abuse Neg Hx   . Depression Neg Hx   . Breast cancer Neg Hx   . Colon cancer Neg Hx    Social History   Substance and Sexual Activity  Sexual Activity No    Outpatient Encounter Medications as of 03/12/2017  Medication Sig  . acetaminophen (TYLENOL) 500 MG tablet Take 500 mg by mouth as needed.    . ARIPiprazole (ABILIFY) 5 MG tablet TAKE 1/2 TABLET BY MOUTH DAILY.  Marland Kitchen Cholecalciferol 2000 units TABS Take 1 tablet (2,000 Units total) by mouth daily.  . furosemide (LASIX) 40 MG tablet TAKE 1 TABLET BY MOUTH ONCE A DAY  . metoprolol (LOPRESSOR) 100 MG tablet Take 0.5 tablets (50 mg total) by mouth 2 (two) times daily.  . rosuvastatin (CRESTOR) 5 MG tablet TAKE 1  TABLET (5 MG TOTAL) BY MOUTH DAILY.  . [DISCONTINUED] metoprolol (LOPRESSOR) 100 MG tablet TAKE 1/2 TABLET BY MOUTH TWICE A DAY   No facility-administered encounter medications on file as of 03/12/2017.     Activities of Daily Living In your present state of health, do you have any difficulty performing the following activities: 03/12/2017  Hearing? Y  Vision? N  Difficulty concentrating or making decisions? Y  Walking or climbing stairs? Y  Dressing or bathing? N  Doing errands, shopping? Y  Preparing Food and eating ? N  Using the Toilet? N  In the past six months, have you accidently leaked urine? N  Do you have problems with loss of bowel control? N  Managing your Medications? N  Managing your Finances? Y  Comment nephew helps with money  Housekeeping or managing your Housekeeping? Y  Some recent data might be hidden    Patient Care Team: Tonia Ghent, MD as PCP - General (Family Medicine)    Assessment:     Hearing Screening   125Hz  250Hz  500Hz  1000Hz  2000Hz  3000Hz  4000Hz  6000Hz  8000Hz   Right ear:   40 40 40  40    Left ear:   0 40 0  40    Vision Screening Comments: Last vision  exam in 2018   Exercise Activities and Dietary recommendations Current Exercise Habits: The patient does not participate in regular exercise at present, Exercise limited by: orthopedic condition(s)  Goals    Starting 03/12/2017, I will continue to take medications as prescribed.        Fall Risk Fall Risk  03/12/2017 03/07/2016 05/31/2012  Falls in the past year? No No No   Depression Screen PHQ 2/9 Scores 03/12/2017 03/07/2016 05/31/2012  PHQ - 2 Score 0 0 2  PHQ- 9 Score 0 - -     Cognitive Function MMSE - Mini Mental State Exam 03/12/2017 03/07/2016  Orientation to time 5 5  Orientation to Place 5 5  Registration 3 3  Attention/ Calculation 0 0  Recall 3 3  Language- name 2 objects 0 0  Language- repeat 1 1  Language- follow 3 step command 3 1  Language- follow 3 step  command-comments - pt was unable to follow 2 steps of 3 step command  Language- read & follow direction 0 0  Write a sentence 0 0  Copy design 0 0  Total score 20 18     PLEASE NOTE: A Mini-Cog screen was completed. Maximum score is 20. A value of 0 denotes this part of Folstein MMSE was not completed or the patient failed this part of the Mini-Cog screening.   Mini-Cog Screening Orientation to Time - Max 5 pts Orientation to Place - Max 5 pts Registration - Max 3 pts Recall - Max 3 pts Language Repeat - Max 1 pts Language Follow 3 Step Command - Max 3 pts     Immunization History  Administered Date(s) Administered  . Influenza Whole 02/20/2006, 02/28/2007, 03/04/2008, 03/10/2009, 02/28/2010  . Influenza,inj,Quad PF,6+ Mos 05/30/2013, 01/19/2014, 01/22/2015, 03/07/2016, 03/12/2017  . Pneumococcal Conjugate-13 03/07/2016  . Pneumococcal Polysaccharide-23 01/07/2004  . Td 01/07/1999, 03/10/2009   Screening Tests Health Maintenance  Topic Date Due  . DEXA SCAN  03/07/2026 (Originally 11/13/2001)  . TETANUS/TDAP  03/11/2019  . INFLUENZA VACCINE  Completed  . PNA vac Low Risk Adult  Completed      Plan:     I have personally reviewed, addressed, and noted the following in the patient's chart:  A. Medical and social history B. Use of alcohol, tobacco or illicit drugs  C. Current medications and supplements D. Functional ability and status E.  Nutritional status F.  Physical activity G. Advance directives H. List of other physicians I.  Hospitalizations, surgeries, and ER visits in previous 12 months J.  [place] to include hearing, vision, cognitive, depression L. Referrals and appointments - none  In addition, I have reviewed and discussed with patient certain preventive protocols, quality metrics, and best practice recommendations. A written personalized care plan for preventive services as well as general preventive health recommendations were provided to  patient.  See attached scanned questionnaire for additional information.   Signed,   Lindell Noe, MHA, BS, LPN Health Coach

## 2017-03-12 NOTE — Progress Notes (Signed)
Pre visit review using our clinic review tool, if applicable. No additional management support is needed unless otherwise documented below in the visit note. 

## 2017-03-12 NOTE — Progress Notes (Signed)
PCP notes:   Health maintenance:  Flu vaccine - administered  Abnormal screenings:   Hearing - failed  Hearing Screening   125Hz  250Hz  500Hz  1000Hz  2000Hz  3000Hz  4000Hz  6000Hz  8000Hz   Right ear:   40 40 40  40    Left ear:   0 40 0  40     Patient concerns:   None  Nurse concerns:  None  Next PCP appt:   03/19/17 @ 1215  I reviewed health advisor's note, was available for consultation on the day of service listed in this note, and agree with documentation and plan. Elsie Stain, MD.

## 2017-03-12 NOTE — Patient Instructions (Addendum)
Ms. Herbel , Thank you for taking time to come for your Medicare Wellness Visit. I appreciate your ongoing commitment to your health goals. Please review the following plan we discussed and let me know if I can assist you in the future.   These are the goals we discussed: Goals    Starting 03/12/2017, I will continue to take medications as prescribed.       This is a list of the screening recommended for you and due dates:  Health Maintenance  Topic Date Due  . DEXA scan (bone density measurement)  03/07/2026*  . Tetanus Vaccine  03/11/2019  . Flu Shot  Completed  . Pneumonia vaccines  Completed  *Topic was postponed. The date shown is not the original due date.   Preventive Care for Adults  A healthy lifestyle and preventive care can promote health and wellness. Preventive health guidelines for adults include the following key practices.  . A routine yearly physical is a good way to check with your health care provider about your health and preventive screening. It is a chance to share any concerns and updates on your health and to receive a thorough exam.  . Visit your dentist for a routine exam and preventive care every 6 months. Brush your teeth twice a day and floss once a day. Good oral hygiene prevents tooth decay and gum disease.  . The frequency of eye exams is based on your age, health, family medical history, use  of contact lenses, and other factors. Follow your health care provider's recommendations for frequency of eye exams.  . Eat a healthy diet. Foods like vegetables, fruits, whole grains, low-fat dairy products, and lean protein foods contain the nutrients you need without too many calories. Decrease your intake of foods high in solid fats, added sugars, and salt. Eat the right amount of calories for you. Get information about a proper diet from your health care provider, if necessary.  . Regular physical exercise is one of the most important things you can do  for your health. Most adults should get at least 150 minutes of moderate-intensity exercise (any activity that increases your heart rate and causes you to sweat) each week. In addition, most adults need muscle-strengthening exercises on 2 or more days a week.  Silver Sneakers may be a benefit available to you. To determine eligibility, you may visit the website: www.silversneakers.com or contact program at 925-580-1538 Mon-Fri between 8AM-8PM.   . Maintain a healthy weight. The body mass index (BMI) is a screening tool to identify possible weight problems. It provides an estimate of body fat based on height and weight. Your health care provider can find your BMI and can help you achieve or maintain a healthy weight.   For adults 20 years and older: ? A BMI below 18.5 is considered underweight. ? A BMI of 18.5 to 24.9 is normal. ? A BMI of 25 to 29.9 is considered overweight. ? A BMI of 30 and above is considered obese.   . Maintain normal blood lipids and cholesterol levels by exercising and minimizing your intake of saturated fat. Eat a balanced diet with plenty of fruit and vegetables. Blood tests for lipids and cholesterol should begin at age 8 and be repeated every 5 years. If your lipid or cholesterol levels are high, you are over 50, or you are at high risk for heart disease, you may need your cholesterol levels checked more frequently. Ongoing high lipid and cholesterol levels should be treated with  medicines if diet and exercise are not working.  . If you smoke, find out from your health care provider how to quit. If you do not use tobacco, please do not start.  . If you choose to drink alcohol, please do not consume more than 2 drinks per day. One drink is considered to be 12 ounces (355 mL) of beer, 5 ounces (148 mL) of wine, or 1.5 ounces (44 mL) of liquor.  . If you are 33-74 years old, ask your health care provider if you should take aspirin to prevent strokes.  . Use sunscreen.  Apply sunscreen liberally and repeatedly throughout the day. You should seek shade when your shadow is shorter than you. Protect yourself by wearing long sleeves, pants, a wide-brimmed hat, and sunglasses year round, whenever you are outdoors.  . Once a month, do a whole body skin exam, using a mirror to look at the skin on your back. Tell your health care provider of new moles, moles that have irregular borders, moles that are larger than a pencil eraser, or moles that have changed in shape or color.

## 2017-03-19 ENCOUNTER — Encounter: Payer: Self-pay | Admitting: Family Medicine

## 2017-03-19 ENCOUNTER — Ambulatory Visit (INDEPENDENT_AMBULATORY_CARE_PROVIDER_SITE_OTHER): Payer: Medicare Other | Admitting: Family Medicine

## 2017-03-19 VITALS — BP 132/78 | HR 55 | Temp 97.6°F | Ht 60.0 in | Wt 172.5 lb

## 2017-03-19 DIAGNOSIS — M129 Arthropathy, unspecified: Secondary | ICD-10-CM | POA: Diagnosis not present

## 2017-03-19 DIAGNOSIS — E785 Hyperlipidemia, unspecified: Secondary | ICD-10-CM

## 2017-03-19 DIAGNOSIS — M791 Myalgia, unspecified site: Secondary | ICD-10-CM | POA: Diagnosis not present

## 2017-03-19 DIAGNOSIS — Z0000 Encounter for general adult medical examination without abnormal findings: Secondary | ICD-10-CM

## 2017-03-19 DIAGNOSIS — R443 Hallucinations, unspecified: Secondary | ICD-10-CM

## 2017-03-19 DIAGNOSIS — I10 Essential (primary) hypertension: Secondary | ICD-10-CM | POA: Diagnosis not present

## 2017-03-19 DIAGNOSIS — Z7189 Other specified counseling: Secondary | ICD-10-CM

## 2017-03-19 MED ORDER — ROSUVASTATIN CALCIUM 5 MG PO TABS: ORAL_TABLET | ORAL | 3 refills | Status: DC

## 2017-03-19 MED ORDER — ARIPIPRAZOLE 5 MG PO TABS: 2.5000 mg | ORAL_TABLET | Freq: Every day | ORAL | 3 refills | Status: DC

## 2017-03-19 MED ORDER — METOPROLOL TARTRATE 100 MG PO TABS: 50.0000 mg | ORAL_TABLET | Freq: Two times a day (BID) | ORAL | 3 refills | Status: DC

## 2017-03-19 MED ORDER — FUROSEMIDE 40 MG PO TABS: 40.0000 mg | ORAL_TABLET | Freq: Every day | ORAL | 3 refills | Status: DC

## 2017-03-19 NOTE — Patient Instructions (Addendum)
You can call for a mammogram at the [facility] Please call for an appointment.    Take tylenol for the knee pain and use an ice bag as needed- 5 minutes on and 5 minutes off.  Try a heating pad on your neck.  Don't fall asleep on it.  Take care.  Glad to see you.  Update me as needed.

## 2017-03-19 NOTE — Progress Notes (Signed)
Hypertension:    Using medication without problems or lightheadedness: yes Chest pain with exertion:no Edema: at baseline   Short of breath: no Labs d/w pt.    H/o hallucinations, better with abilify.  No hallucinations now.  No ADE on med.    Elevated Cholesterol: Using medications without problems: yes Muscle aches:  Not from statin Diet compliance: encouraged. Veggies with most meals, not eating fast food/eating out.  Exercise: limited by knee pain.   She has B knee pain.  Worse with the weather change.  Taking tylenol as needed for pain.   Health maintenance:  Flu vaccine - administered Tetanus up to date.   PNA up to date.  Shingles d/w pt, currently backordered.   Pap not due. Colonoscopy not due given age.  D/w pt.  Mammogram done 2016, d/w pt.    DXA declined.   Advance directive- d/w pt.  Encouraged he to consider.  She doesn't have a person formally designated. Hearing - failed.  Declined hearing aids.   She still misses her husband, but is "getting by."  Discussed.  Living with her godson.    She has had some episodic left-sided neck pain.  No trauma.  Meds, vitals, and allergies reviewed.   PMH and SH reviewed  ROS: Per HPI unless specifically indicated in ROS section   GEN: nad, alert and oriented HEENT: mucous membranes moist NECK: supple w/o LA, no midline pain but left trapezius is tender to palpation without rash. CV: rrr. PULM: ctab, no inc wob ABD: soft, +bs EXT: 1-2 + BLE edema at baseline.  SKIN: no acute rash Bilateral mild knee crepitus with B medial joint line tenderness noted.

## 2017-03-21 DIAGNOSIS — M791 Myalgia, unspecified site: Secondary | ICD-10-CM | POA: Insufficient documentation

## 2017-03-21 NOTE — Assessment & Plan Note (Signed)
Controlled.  No adverse effect of medication.  Continue as is.  Labs discussed with patient.  She agrees.

## 2017-03-21 NOTE — Assessment & Plan Note (Signed)
Likely trapezius strain.  Discussed with patient about using heat as needed.  Routine cautions given.  See after visit summary.  She agrees.

## 2017-03-21 NOTE — Assessment & Plan Note (Signed)
Advance directive- d/w pt.  Encouraged he to consider.  She doesn't have a person formally designated.

## 2017-03-21 NOTE — Assessment & Plan Note (Signed)
Resolved with Abilify.  No hallucinations now.  Continue as is.

## 2017-03-21 NOTE — Assessment & Plan Note (Addendum)
Controlled.  No adverse effect of medication.  Continue as is.  Labs discussed with patient.  She agrees. Her edema is at baseline.  Continue as is.  Update me if worsening.  Lungs are clear.  She agrees.  She has already been wearing over-the-counter support stockings. >25 minutes spent in face to face time with patient, >50% spent in counselling or coordination of care

## 2017-03-21 NOTE — Assessment & Plan Note (Signed)
Flu vaccine - administered Tetanus up to date.   PNA up to date.  Shingles d/w pt, currently backordered.   Pap not due. Colonoscopy not due given age.  D/w pt.  Mammogram done 2016, d/w pt.    DXA declined.   Advance directive- d/w pt.  Encouraged he to consider.  She doesn't have a person formally designated. Hearing - failed.  Declined hearing aids.   She still misses her husband, but is "getting by."  Discussed.  Living with her godson.

## 2017-03-21 NOTE — Assessment & Plan Note (Signed)
Discussed with patient about Tylenol and appropriate use of ice.  Update me as needed.  She agrees.

## 2017-08-02 DIAGNOSIS — Z961 Presence of intraocular lens: Secondary | ICD-10-CM | POA: Diagnosis not present

## 2017-09-20 ENCOUNTER — Other Ambulatory Visit: Payer: Self-pay | Admitting: Family Medicine

## 2017-09-20 NOTE — Telephone Encounter (Signed)
Copied from [place]. Topic: Quick Communication - Rx Refill/Question >> Sep 20, 2017 10:47 AM Oliver Pila B wrote: Medication: ARIPiprazole (ABILIFY) 5 MG tablet [[id number]]  Has the patient contacted their pharmacy? Yes.   (Agent: If no, request that the patient contact the pharmacy for the refill.) Preferred Pharmacy (with phone number or street name): [facility] Agent: Please be advised that RX refills may take up to 3 business days. We ask that you follow-up with your pharmacy.

## 2017-09-20 NOTE — Telephone Encounter (Signed)
Call to the pharmacy- patient is not due refill until next month. Attempt to call patient- VM full- unable to leave message.

## 2017-10-23 ENCOUNTER — Encounter: Payer: Self-pay | Admitting: Family Medicine

## 2017-10-23 ENCOUNTER — Ambulatory Visit (INDEPENDENT_AMBULATORY_CARE_PROVIDER_SITE_OTHER): Payer: Medicare Other | Admitting: Family Medicine

## 2017-10-23 DIAGNOSIS — J069 Acute upper respiratory infection, unspecified: Secondary | ICD-10-CM | POA: Diagnosis not present

## 2017-10-23 MED ORDER — FLUTICASONE PROPIONATE 50 MCG/ACT NA SUSP: 2.0000 | Freq: Every day | NASAL | Status: AC

## 2017-10-23 MED ORDER — AMOXICILLIN-POT CLAVULANATE 875-125 MG PO TABS: 1.0000 | ORAL_TABLET | Freq: Two times a day (BID) | ORAL | 0 refills | Status: DC

## 2017-10-23 MED ORDER — BENZONATATE 200 MG PO CAPS: 200.0000 mg | ORAL_CAPSULE | Freq: Three times a day (TID) | ORAL | 0 refills | Status: DC | PRN

## 2017-10-23 NOTE — Patient Instructions (Signed)
Use tessalon for the cough.  I sent that to the pharmacy.  Start using flonase- you can get that over the counter.  If the facial pain gets worse, then start augmentin (antibiotic). Update me as needed.  Take care.  Glad to see you.

## 2017-10-23 NOTE — Progress Notes (Signed)
She got sick about 10 days ago.  Some cough, runny nose.  No fevers.  No vomiting.  Eating okay.  Not SOB.  Still coughing.  Some HA, B temples.  No vision changes.  No ear pain but sometimes with a "funny noise" in the L ear.  Some maxillary pain.  Throat feels a little dry.  No known sick contacts.   Meds, vitals, and allergies reviewed.   ROS: Per HPI unless specifically indicated in ROS section   GEN: nad, alert and oriented HEENT: mucous membranes moist, tm w/o erythema, nasal exam w/o erythema, clear discharge noted,  OP with cobblestoning, sinuses not ttp  NECK: supple w/o LA CV: rrr.   PULM: ctab, no inc wob EXT: 1-2+ BLE edema at baseline.  SKIN: no acute rash

## 2017-10-24 DIAGNOSIS — J069 Acute upper respiratory infection, unspecified: Secondary | ICD-10-CM | POA: Insufficient documentation

## 2017-10-24 NOTE — Assessment & Plan Note (Signed)
Nontoxic.  Okay for outpatient follow-up.  Discussed with patient about options and differential diagnosis.  Potentially viral. Use tessalon for the cough. Start using flonase.  If the facial pain gets worse, then start augmentin. Update me as needed.  She agrees.

## 2018-03-05 ENCOUNTER — Other Ambulatory Visit: Payer: Self-pay | Admitting: Family Medicine

## 2018-03-05 NOTE — Telephone Encounter (Signed)
Last labs and OV 03/2017. pls advise

## 2018-03-06 NOTE — Telephone Encounter (Signed)
Sent. Thanks.  18min OV with me when possible.

## 2018-03-06 NOTE — Telephone Encounter (Signed)
Please call and schedule appointment as instructed. 

## 2018-03-07 NOTE — Telephone Encounter (Signed)
Appointment made for 03/11/2018-

## 2018-03-07 NOTE — Telephone Encounter (Signed)
Called patient's phone number and could not leave a message, called daughter, Ms Suezanne Cheshire, and spoke with her. Daughter will speak with patient and call back to schedule.

## 2018-03-11 ENCOUNTER — Ambulatory Visit (INDEPENDENT_AMBULATORY_CARE_PROVIDER_SITE_OTHER): Payer: Medicare Other | Admitting: Family Medicine

## 2018-03-11 ENCOUNTER — Encounter: Payer: Self-pay | Admitting: Family Medicine

## 2018-03-11 VITALS — BP 122/66 | HR 52 | Temp 97.7°F | Ht 60.0 in | Wt 178.5 lb

## 2018-03-11 DIAGNOSIS — R443 Hallucinations, unspecified: Secondary | ICD-10-CM | POA: Diagnosis not present

## 2018-03-11 DIAGNOSIS — E785 Hyperlipidemia, unspecified: Secondary | ICD-10-CM

## 2018-03-11 DIAGNOSIS — R413 Other amnesia: Secondary | ICD-10-CM | POA: Diagnosis not present

## 2018-03-11 DIAGNOSIS — Z0000 Encounter for general adult medical examination without abnormal findings: Secondary | ICD-10-CM

## 2018-03-11 DIAGNOSIS — Z23 Encounter for immunization: Secondary | ICD-10-CM | POA: Diagnosis not present

## 2018-03-11 DIAGNOSIS — Z7189 Other specified counseling: Secondary | ICD-10-CM

## 2018-03-11 DIAGNOSIS — M129 Arthropathy, unspecified: Secondary | ICD-10-CM | POA: Diagnosis not present

## 2018-03-11 DIAGNOSIS — I10 Essential (primary) hypertension: Secondary | ICD-10-CM | POA: Diagnosis not present

## 2018-03-11 DIAGNOSIS — E559 Vitamin D deficiency, unspecified: Secondary | ICD-10-CM

## 2018-03-11 DIAGNOSIS — R739 Hyperglycemia, unspecified: Secondary | ICD-10-CM

## 2018-03-11 MED ORDER — ROSUVASTATIN CALCIUM 5 MG PO TABS: ORAL_TABLET | ORAL | 3 refills | Status: DC

## 2018-03-11 MED ORDER — FUROSEMIDE 40 MG PO TABS: 40.0000 mg | ORAL_TABLET | Freq: Every day | ORAL | 3 refills | Status: DC

## 2018-03-11 MED ORDER — METOPROLOL TARTRATE 100 MG PO TABS: 50.0000 mg | ORAL_TABLET | Freq: Two times a day (BID) | ORAL | 3 refills | Status: DC

## 2018-03-11 MED ORDER — ARIPIPRAZOLE 5 MG PO TABS: 2.5000 mg | ORAL_TABLET | Freq: Every day | ORAL | 3 refills | Status: DC

## 2018-03-11 NOTE — Progress Notes (Signed)
She has some fatigue and some of that may be age related.  No CP, not SOB.  Discussed.  She will monitor and update me.  Hypertension:    Using medication without problems or lightheadedness: yes Chest pain with exertion:no Edema:some BLE edema.  Taking lasix daily with some relief . Short of breath:no Labs pending.   Elevated Cholesterol: Using medications without problems: yes Muscle aches: Likely not from statin, discussed. Diet compliance: "its off and on."   Exercise: "none."  D/w pt.  She is walking some, encouraged.   H/o hallucinations, better with abilify.  No hallucinations now.  No ADE on med.    Flu vaccine- administered Tetanus up to date.   PNA up to date.  Shingles d/w pt, currently backordered.   Pap not due. Colonoscopy not due given age.  D/w pt.  Mammogram done 2016, d/w pt.  See avs.   DXA declined.   Advance directive- d/w pt. She would have Fortunato Curling and Sharlene Dory equally designated if patient were incapacitated.  She would prefer both to be involved in decision making.    She still has occ memory lapses.  She isn't driving now.  Her brother drove to the clinic today.  No red flag events.  Doesn't get lost.   Oriented to year, month, day.  Can do math.  Can read a watch.  3/3 attention, 2/3 recall- 3/3 with minor prompt. We agreed to monitor for now.    She is putting up with gait changes due to joint pain.  She can tolerate as is and will update me as needed.  Discussed.  PMH and SH reviewed  Meds, vitals, and allergies reviewed.   ROS: Per HPI unless specifically indicated in ROS section   GEN: nad, alert and oriented HEENT: mucous membranes moist NECK: supple w/o LA CV: rrr. PULM: ctab, no inc wob ABD: soft, +bs EXT: 1+  BLE edema SKIN: no acute rash She has an arthritic gait due to knee pain.

## 2018-03-11 NOTE — Patient Instructions (Addendum)
If your memory is getting worse then let me know.  Go to the lab on the way out.  We'll contact you with your lab report. Flu shot today.    You can call for a mammogram at [facility]  Take care.  Glad to see you.  Update me as needed.

## 2018-03-13 ENCOUNTER — Other Ambulatory Visit (INDEPENDENT_AMBULATORY_CARE_PROVIDER_SITE_OTHER): Payer: Medicare Other

## 2018-03-13 DIAGNOSIS — E785 Hyperlipidemia, unspecified: Secondary | ICD-10-CM

## 2018-03-13 DIAGNOSIS — R739 Hyperglycemia, unspecified: Secondary | ICD-10-CM | POA: Diagnosis not present

## 2018-03-13 DIAGNOSIS — E559 Vitamin D deficiency, unspecified: Secondary | ICD-10-CM | POA: Diagnosis not present

## 2018-03-13 LAB — VITAMIN D 25 HYDROXY (VIT D DEFICIENCY, FRACTURES): VITD: 52.81 ng/mL (ref 30.00–100.00)

## 2018-03-13 LAB — COMPREHENSIVE METABOLIC PANEL
ALBUMIN: 4.1 g/dL (ref 3.5–5.2)
ALK PHOS: 43 U/L (ref 39–117)
ALT: 20 U/L (ref 0–35)
AST: 21 U/L (ref 0–37)
BUN: 10 mg/dL (ref 6–23)
CALCIUM: 9.4 mg/dL (ref 8.4–10.5)
CO2: 26 mEq/L (ref 19–32)
CREATININE: 1.14 mg/dL (ref 0.40–1.20)
Chloride: 107 mEq/L (ref 96–112)
GFR: 58.78 mL/min — ABNORMAL LOW (ref >60.00)
Glucose, Bld: 103 mg/dL — ABNORMAL HIGH (ref 70–99)
Potassium: 4 mEq/L (ref 3.5–5.1)
SODIUM: 141 meq/L (ref 135–145)
TOTAL PROTEIN: 7.5 g/dL (ref 6.0–8.3)
Total Bilirubin: 1.3 mg/dL — ABNORMAL HIGH (ref 0.2–1.2)

## 2018-03-13 LAB — LIPID PANEL
CHOLESTEROL: 126 mg/dL (ref 0–200)
HDL: 63.6 mg/dL (ref >39.00)
LDL Cholesterol: 50 mg/dL (ref 0–99)
NonHDL: 62.05
TRIGLYCERIDES: 60 mg/dL (ref 0.0–149.0)
Total CHOL/HDL Ratio: 2
VLDL: 12 mg/dL (ref 0.0–40.0)

## 2018-03-13 LAB — HEMOGLOBIN A1C: HEMOGLOBIN A1C: 5.5 % (ref 4.6–6.5)

## 2018-03-13 NOTE — Assessment & Plan Note (Signed)
Discussed with patient.  Occasional lapses.  No red flag events.  Reasonable to monitor for now.  She had normal testing today with the exception of one item on recall that she missed initially but she remembered that with brief prompting.

## 2018-03-13 NOTE — Assessment & Plan Note (Signed)
Advance directive- d/w pt. She would have Fortunato Curling and Sharlene Dory equally designated if patient were incapacitated.  She would prefer both to be involved in decision making.

## 2018-03-13 NOTE — Assessment & Plan Note (Signed)
Flu vaccine- administered Tetanus up to date.   PNA up to date.  Shingles d/w pt, currently backordered.   Pap not due. Colonoscopy not due given age.  D/w pt.  Mammogram done 2016, d/w pt.  See avs.   DXA declined.   Advance directive- d/w pt. She would have Fortunato Curling and Sharlene Dory equally designated if patient were incapacitated.  She would prefer both to be involved in decision making.

## 2018-03-13 NOTE — Assessment & Plan Note (Signed)
She is putting up with joint pain that affects her gait.  She is able to get by as is and will update me as needed.

## 2018-03-13 NOTE — Assessment & Plan Note (Signed)
Reasonable control.  Continue Lasix as needed for swelling.  She can elevate her feet as needed.  Labs discussed see notes on labs.

## 2018-03-13 NOTE — Assessment & Plan Note (Signed)
Discussed diet and exercise as tolerated.  See notes on labs.  No obvious adverse effect from medication.  Would continue as is for now.  She agrees.

## 2018-03-13 NOTE — Assessment & Plan Note (Signed)
History of, resolved on lower dose of Abilify without obvious adverse effect on medication.  Likely reasonable to continue as is as the benefit of the medication likely outweighs the risk at this point.  Discussed with patient.  She agrees.

## 2019-04-22 ENCOUNTER — Other Ambulatory Visit: Payer: Self-pay | Admitting: Family Medicine

## 2019-05-11 ENCOUNTER — Other Ambulatory Visit: Payer: Self-pay | Admitting: Family Medicine

## 2019-05-11 DIAGNOSIS — I10 Essential (primary) hypertension: Secondary | ICD-10-CM

## 2019-05-11 DIAGNOSIS — E559 Vitamin D deficiency, unspecified: Secondary | ICD-10-CM

## 2019-05-11 DIAGNOSIS — R739 Hyperglycemia, unspecified: Secondary | ICD-10-CM

## 2019-05-15 ENCOUNTER — Other Ambulatory Visit: Payer: Self-pay

## 2019-05-15 ENCOUNTER — Ambulatory Visit (INDEPENDENT_AMBULATORY_CARE_PROVIDER_SITE_OTHER): Payer: Medicare Other

## 2019-05-15 ENCOUNTER — Other Ambulatory Visit (INDEPENDENT_AMBULATORY_CARE_PROVIDER_SITE_OTHER): Payer: Medicare Other

## 2019-05-15 DIAGNOSIS — R739 Hyperglycemia, unspecified: Secondary | ICD-10-CM

## 2019-05-15 DIAGNOSIS — E559 Vitamin D deficiency, unspecified: Secondary | ICD-10-CM | POA: Diagnosis not present

## 2019-05-15 DIAGNOSIS — I10 Essential (primary) hypertension: Secondary | ICD-10-CM | POA: Diagnosis not present

## 2019-05-15 DIAGNOSIS — Z0000 Encounter for general adult medical examination without abnormal findings: Secondary | ICD-10-CM

## 2019-05-15 LAB — COMPREHENSIVE METABOLIC PANEL
ALT: 13 U/L (ref 0–35)
AST: 15 U/L (ref 0–37)
Albumin: 4.2 g/dL (ref 3.5–5.2)
Alkaline Phosphatase: 45 U/L (ref 39–117)
BUN: 18 mg/dL (ref 6–23)
CO2: 30 mEq/L (ref 19–32)
Calcium: 9.8 mg/dL (ref 8.4–10.5)
Chloride: 107 mEq/L (ref 96–112)
Creatinine, Ser: 1.27 mg/dL — ABNORMAL HIGH (ref 0.40–1.20)
GFR: 48.69 mL/min — ABNORMAL LOW (ref >60.00)
Glucose, Bld: 122 mg/dL — ABNORMAL HIGH (ref 70–99)
Potassium: 4.1 mEq/L (ref 3.5–5.1)
Sodium: 142 mEq/L (ref 135–145)
Total Bilirubin: 1.2 mg/dL (ref 0.2–1.2)
Total Protein: 7.5 g/dL (ref 6.0–8.3)

## 2019-05-15 LAB — LIPID PANEL
Cholesterol: 128 mg/dL (ref 0–200)
HDL: 59.9 mg/dL (ref >39.00)
LDL Cholesterol: 55 mg/dL (ref 0–99)
NonHDL: 67.91
Total CHOL/HDL Ratio: 2
Triglycerides: 63 mg/dL (ref 0.0–149.0)
VLDL: 12.6 mg/dL (ref 0.0–40.0)

## 2019-05-15 LAB — HEMOGLOBIN A1C: Hgb A1c MFr Bld: 5.6 % (ref 4.6–6.5)

## 2019-05-15 LAB — VITAMIN D 25 HYDROXY (VIT D DEFICIENCY, FRACTURES): VITD: 45.66 ng/mL (ref 30.00–100.00)

## 2019-05-15 NOTE — Patient Instructions (Signed)
Kristen Goodman , Thank you for taking time to come for your Medicare Wellness Visit. I appreciate your ongoing commitment to your health goals. Please review the following plan we discussed and let me know if I can assist you in the future.   Screening recommendations/referrals: Colonoscopy: no longer required Mammogram: no longer required Bone Density: declined Recommended yearly ophthalmology/optometry visit for glaucoma screening and checkup Recommended yearly dental visit for hygiene and checkup  Vaccinations: Influenza vaccine: will get at physical Pneumococcal vaccine: Completed series Tdap vaccine: decline Shingles vaccine: discussed    Advanced directives: Please bring a copy of your POA (Power of Attorney) and/or Living Will to your next appointment.  Conditions/risks identified: hypertension, hyperlipidemia  Next appointment: 05/20/2019 @ 10:45 am    Preventive Care 65 Years and Older, Female Preventive care refers to lifestyle choices and visits with your health care provider that can promote health and wellness. What does preventive care include?  A yearly physical exam. This is also called an annual well check.  Dental exams once or twice a year.  Routine eye exams. Ask your health care provider how often you should have your eyes checked.  Personal lifestyle choices, including:  Daily care of your teeth and gums.  Regular physical activity.  Eating a healthy diet.  Avoiding tobacco and drug use.  Limiting alcohol use.  Practicing safe sex.  Taking low-dose aspirin every day.  Taking vitamin and mineral supplements as recommended by your health care provider. What happens during an annual well check? The services and screenings done by your health care provider during your annual well check will depend on your age, overall health, lifestyle risk factors, and family history of disease. Counseling  Your health care provider may ask you questions about  your:  Alcohol use.  Tobacco use.  Drug use.  Emotional well-being.  Home and relationship well-being.  Sexual activity.  Eating habits.  History of falls.  Memory and ability to understand (cognition).  Work and work Statistician.  Reproductive health. Screening  You may have the following tests or measurements:  Height, weight, and BMI.  Blood pressure.  Lipid and cholesterol levels. These may be checked every 5 years, or more frequently if you are over 77 years old.  Skin check.  Lung cancer screening. You may have this screening every year starting at age 76 if you have a 30-pack-year history of smoking and currently smoke or have quit within the past 15 years.  Fecal occult blood test (FOBT) of the stool. You may have this test every year starting at age 2.  Flexible sigmoidoscopy or colonoscopy. You may have a sigmoidoscopy every 5 years or a colonoscopy every 10 years starting at age 27.  Hepatitis C blood test.  Hepatitis B blood test.  Sexually transmitted disease (STD) testing.  Diabetes screening. This is done by checking your blood sugar (glucose) after you have not eaten for a while (fasting). You may have this done every 1-3 years.  Bone density scan. This is done to screen for osteoporosis. You may have this done starting at age 72.  Mammogram. This may be done every 1-2 years. Talk to your health care provider about how often you should have regular mammograms. Talk with your health care provider about your test results, treatment options, and if necessary, the need for more tests. Vaccines  Your health care provider may recommend certain vaccines, such as:  Influenza vaccine. This is recommended every year.  Tetanus, diphtheria, and acellular pertussis (  Tdap, Td) vaccine. You may need a Td booster every 10 years.  Zoster vaccine. You may need this after age 28.  Pneumococcal 13-valent conjugate (PCV13) vaccine. One dose is recommended  after age 70.  Pneumococcal polysaccharide (PPSV23) vaccine. One dose is recommended after age 53. Talk to your health care provider about which screenings and vaccines you need and how often you need them. This information is not intended to replace advice given to you by your health care provider. Make sure you discuss any questions you have with your health care provider. Document Released: 05/21/2015 Document Revised: 01/12/2016 Document Reviewed: 02/23/2015 Elsevier Interactive Patient Education  2017 [place] Prevention in the Home Falls can cause injuries. They can happen to people of all ages. There are many things you can do to make your home safe and to help prevent falls. What can I do on the outside of my home?  Regularly fix the edges of walkways and driveways and fix any cracks.  Remove anything that might make you trip as you walk through a door, such as a raised step or threshold.  Trim any bushes or trees on the path to your home.  Use bright outdoor lighting.  Clear any walking paths of anything that might make someone trip, such as rocks or tools.  Regularly check to see if handrails are loose or broken. Make sure that both sides of any steps have handrails.  Any raised decks and porches should have guardrails on the edges.  Have any leaves, snow, or ice cleared regularly.  Use sand or salt on walking paths during winter.  Clean up any spills in your garage right away. This includes oil or grease spills. What can I do in the bathroom?  Use night lights.  Install grab bars by the toilet and in the tub and shower. Do not use towel bars as grab bars.  Use non-skid mats or decals in the tub or shower.  If you need to sit down in the shower, use a plastic, non-slip stool.  Keep the floor dry. Clean up any water that spills on the floor as soon as it happens.  Remove soap buildup in the tub or shower regularly.  Attach bath mats securely with  double-sided non-slip rug tape.  Do not have throw rugs and other things on the floor that can make you trip. What can I do in the bedroom?  Use night lights.  Make sure that you have a light by your bed that is easy to reach.  Do not use any sheets or blankets that are too big for your bed. They should not hang down onto the floor.  Have a firm chair that has side arms. You can use this for support while you get dressed.  Do not have throw rugs and other things on the floor that can make you trip. What can I do in the kitchen?  Clean up any spills right away.  Avoid walking on wet floors.  Keep items that you use a lot in easy-to-reach places.  If you need to reach something above you, use a strong step stool that has a grab bar.  Keep electrical cords out of the way.  Do not use floor polish or wax that makes floors slippery. If you must use wax, use non-skid floor wax.  Do not have throw rugs and other things on the floor that can make you trip. What can I do with my stairs?  Do  not leave any items on the stairs.  Make sure that there are handrails on both sides of the stairs and use them. Fix handrails that are broken or loose. Make sure that handrails are as long as the stairways.  Check any carpeting to make sure that it is firmly attached to the stairs. Fix any carpet that is loose or worn.  Avoid having throw rugs at the top or bottom of the stairs. If you do have throw rugs, attach them to the floor with carpet tape.  Make sure that you have a light switch at the top of the stairs and the bottom of the stairs. If you do not have them, ask someone to add them for you. What else can I do to help prevent falls?  Wear shoes that:  Do not have high heels.  Have rubber bottoms.  Are comfortable and fit you well.  Are closed at the toe. Do not wear sandals.  If you use a stepladder:  Make sure that it is fully opened. Do not climb a closed stepladder.  Make  sure that both sides of the stepladder are locked into place.  Ask someone to hold it for you, if possible.  Clearly mark and make sure that you can see:  Any grab bars or handrails.  First and last steps.  Where the edge of each step is.  Use tools that help you move around (mobility aids) if they are needed. These include:  Canes.  Walkers.  Scooters.  Crutches.  Turn on the lights when you go into a dark area. Replace any light bulbs as soon as they burn out.  Set up your furniture so you have a clear path. Avoid moving your furniture around.  If any of your floors are uneven, fix them.  If there are any pets around you, be aware of where they are.  Review your medicines with your doctor. Some medicines can make you feel dizzy. This can increase your chance of falling. Ask your doctor what other things that you can do to help prevent falls. This information is not intended to replace advice given to you by your health care provider. Make sure you discuss any questions you have with your health care provider. Document Released: 02/18/2009 Document Revised: 09/30/2015 Document Reviewed: 05/29/2014 Elsevier Interactive Patient Education  2017 [employer].

## 2019-05-15 NOTE — Progress Notes (Signed)
PCP notes:  Health Maintenance: Needs flu vaccine   Abnormal Screenings: none   Patient concerns: Patient complains of mouth pain and left shoulder pain. Onset 1 week ago.    Nurse concerns: none   Next PCP appt.: 05/20/2019 @ 10:45 am

## 2019-05-15 NOTE — Progress Notes (Signed)
Subjective:   Kristen Goodman is a 85 y.o. female who presents for Medicare Annual (Subsequent) preventive examination.  Review of Systems: N/A   This visit is being conducted through telemedicine via telephone at the nurse health advisor's home address due to the COVID-19 pandemic. This patient has given me verbal consent via doximity to conduct this visit, patient states they are participating from their home address. Patient and myself are on the telephone call. There is no referral for this visit. Some vital signs may be absent or patient reported.    Patient identification: identified by name, DOB, and current address   Cardiac Risk Factors include: advanced age (>85men, >11 women);hypertension;dyslipidemia     Objective:     Vitals: There were no vitals taken for this visit.  There is no height or weight on file to calculate BMI.  Advanced Directives 05/15/2019 03/12/2017 03/07/2016 04/06/2014  Does Patient Have a Medical Advance Directive? Yes No Yes No  Type of Paramedic of [place];Living will - [place];Living will -  Does patient want to make changes to medical advance directive? - - No - Patient declined -  Copy of [place] in Chart? No - copy requested - No - copy requested -  Would patient like information on creating a medical advance directive? - Yes (MAU/Ambulatory/Procedural Areas - Information given) - No - patient declined information    Tobacco Social History   Tobacco Use  Smoking Status Never Smoker  Smokeless Tobacco Never Used     Counseling given: Not Answered   Clinical Intake:  Pre-visit preparation completed: Yes  Pain : No/denies pain     Nutritional Risks: None Diabetes: No  How often do you need to have someone help you when you read instructions, pamphlets, or other written materials from your doctor or pharmacy?: 1 - Never What is the last grade level you completed  in school?: 12th  Interpreter Needed?: No  Information entered by :: CJohnson, LPN  Past Medical History:  Diagnosis Date   Arthritis    knee   Hyperlipidemia 03/09/1995   Hypertension 05/08/1978   Memory changes    with hallucinations   Past Surgical History:  Procedure Laterality Date   ABDOMINAL HYSTERECTOMY  07/04/1985   HRT s/p partial hyst for dysmenorrhea/ menopause   CATARACT EXTRACTION Bilateral    Family History  Problem Relation Age of Onset   Hypertension Mother    Diabetes Father    Hypertension Father    Heart disease Sister    Diabetes Brother    Diabetes Brother    Cancer Neg Hx    Alcohol abuse Neg Hx    Depression Neg Hx    Breast cancer Neg Hx    Colon cancer Neg Hx    Social History   Socioeconomic History   Marital status: Widowed    Spouse name: Not on file   Number of children: Not on file   Years of education: Not on file   Highest education level: Not on file  Occupational History   Not on file  Tobacco Use   Smoking status: Never Smoker   Smokeless tobacco: Never Used  Substance and Sexual Activity   Alcohol use: No    Alcohol/week: 0.0 standard drinks   Drug use: No   Sexual activity: Never  Other Topics Concern   Not on file  Social History Narrative   1 step daughter   Retired, 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     Lives in her home along with her godson.    Social Determinants of Health   Financial Resource Strain: Low Risk    Difficulty of Paying Living Expenses: Not hard at all  Food Insecurity: No Food Insecurity   Worried About Charity fundraiser in the Last Year: Never true   [place] in the Last Year: Never true  Transportation Needs: No Transportation Needs   Lack of Transportation (Medical): No   Lack of Transportation (Non-Medical): No  Physical Activity: Inactive   Days of Exercise per Week: 0 days   Minutes  of Exercise per Session: 0 min  Stress: No Stress Concern Present   Feeling of Stress : Not at all  Social Connections:    Frequency of Communication with Friends and Family: Not on file   Frequency of Social Gatherings with Friends and Family: Not on file   Attends Religious Services: Not on file   Active Member of Clubs or Organizations: Not on file   Attends Archivist Meetings: Not on file   Marital Status: Not on file    Outpatient Encounter Medications as of 05/15/2019  Medication Sig   acetaminophen (TYLENOL) 500 MG tablet Take 500 mg by mouth as needed.     ARIPiprazole (ABILIFY) 5 MG tablet TAKE 1/2 TABLET BY MOUTH DAILY   Cholecalciferol 2000 units TABS Take 1 tablet (2,000 Units total) by mouth daily.   fluticasone (FLONASE) 50 MCG/ACT nasal spray Place 2 sprays into both nostrils daily.   furosemide (LASIX) 40 MG tablet TAKE 1 TABLET BY MOUTH EVERY DAY   metoprolol tartrate (LOPRESSOR) 100 MG tablet TAKE 0.5 TABLETS (50 MG TOTAL) BY MOUTH 2 (TWO) TIMES DAILY.   rosuvastatin (CRESTOR) 5 MG tablet TAKE 1 TABLET BY MOUTH EVERY DAY   No facility-administered encounter medications on file as of 05/15/2019.    Activities of Daily Living In your present state of health, do you have any difficulty performing the following activities: 05/15/2019  Hearing? Y  Comment some hearing loss noted  Vision? N  Difficulty concentrating or making decisions? Y  Comment some memory loss noted  Walking or climbing stairs? N  Dressing or bathing? N  Doing errands, shopping? N  Preparing Food and eating ? N  Using the Toilet? N  In the past six months, have you accidently leaked urine? Y  Comment some leakage at times  Do you have problems with loss of bowel control? N  Managing your Medications? N  Managing your Finances? N  Housekeeping or managing your Housekeeping? N  Some recent data might be hidden    Patient Care Team: Tonia Ghent, MD as PCP - General  (Family Medicine)    Assessment:   This is a routine wellness examination for Kristen Goodman.  Exercise Activities and Dietary recommendations Current Exercise Habits: The patient does not participate in regular exercise at present, Exercise limited by: None identified  Goals     health management     Starting 03/12/2017, I will continue to take medications as prescribed.      Increase water intake     Starting 03/07/2016, I will attempt to drink at least 6-8 glasses of water daily.      Patient Stated     05/15/2019, I will maintain and continue medications as prescribed.        Fall Risk Fall Risk  05/15/2019  03/12/2017 03/07/2016 05/31/2012  Falls in the past year? 0 No No No  Number falls in past yr: 0 - - -  Injury with Fall? 0 - - -  Risk for fall due to : Medication side effect - - -  Follow up Falls evaluation completed;Falls prevention discussed - - -   Is the patient's home free of loose throw rugs in walkways, pet beds, electrical cords, etc?   yes      Grab bars in the bathroom? no      Handrails on the stairs?   no      Adequate lighting?   yes  Timed Get Up and Go performed: N/A  Depression Screen PHQ 2/9 Scores 05/15/2019 03/12/2017 03/07/2016 05/31/2012  PHQ - 2 Score 0 0 0 2  PHQ- 9 Score 0 0 - -     Cognitive Function MMSE - Mini Mental State Exam 05/15/2019 03/12/2017 03/07/2016  Orientation to time 5 5 5   Orientation to Place 5 5 5   Registration 3 3 3   Attention/ Calculation 5 0 0  Recall 2 3 3   Language- name 2 objects - 0 0  Language- repeat 1 1 1   Language- follow 3 step command - 3 1  Language- follow 3 step command-comments - - pt was unable to follow 2 steps of 3 step command  Language- read & follow direction - 0 0  Write a sentence - 0 0  Copy design - 0 0  Total score - 20 18  Mini Cog  Mini-Cog screen was completed. Maximum score is 22. A value of 0 denotes this part of the MMSE was not completed or the patient failed this part of the Mini-Cog  screening.       Immunization History  Administered Date(s) Administered   Influenza Whole 02/20/2006, 02/28/2007, 03/04/2008, 03/10/2009, 02/28/2010   Influenza,inj,Quad PF,6+ Mos 05/30/2013, 01/19/2014, 01/22/2015, 03/07/2016, 03/12/2017, 03/11/2018   Pneumococcal Conjugate-13 03/07/2016   Pneumococcal Polysaccharide-23 01/07/2004   Td 01/07/1999, 03/10/2009    Qualifies for Shingles Vaccine? Yes  Screening Tests Health Maintenance  Topic Date Due   INFLUENZA VACCINE  12/07/2018   TETANUS/TDAP  05/14/2020 (Originally 03/11/2019)   DEXA SCAN  03/07/2026 (Originally 11/13/2001)   PNA vac Low Risk Adult  Completed    Cancer Screenings: Lung: Low Dose CT Chest recommended if Age 21-80 years, 30 pack-year currently smoking OR have quit w/in 15years. Patient does not qualify. Breast:  Up to date on Mammogram? Yes, no longer required   Up to date of Bone Density/Dexa? Declined Colorectal: no longer required  Additional Screenings:  Hepatitis C Screening: N/A     Plan:    Patient will maintain and continue medications as prescribed.    I have personally reviewed and noted the following in the patients chart:    Medical and social history  Use of alcohol, tobacco or illicit drugs   Current medications and supplements  Functional ability and status  Nutritional status  Physical activity  Advanced directives  List of other physicians  Hospitalizations, surgeries, and ER visits in previous 12 months  Vitals  Screenings to include cognitive, depression, and falls  Referrals and appointments  In addition, I have reviewed and discussed with patient certain preventive protocols, quality metrics, and best practice recommendations. A written personalized care plan for preventive services as well as general preventive health recommendations were provided to patient.     Andrez Grime, LPN  [phone]

## 2019-05-20 ENCOUNTER — Ambulatory Visit (INDEPENDENT_AMBULATORY_CARE_PROVIDER_SITE_OTHER): Payer: Medicare Other | Admitting: Family Medicine

## 2019-05-20 ENCOUNTER — Encounter: Payer: Self-pay | Admitting: Family Medicine

## 2019-05-20 ENCOUNTER — Other Ambulatory Visit: Payer: Self-pay

## 2019-05-20 VITALS — BP 150/90 | HR 69 | Temp 97.4°F | Resp 18 | Ht 60.0 in | Wt 180.9 lb

## 2019-05-20 DIAGNOSIS — I10 Essential (primary) hypertension: Secondary | ICD-10-CM

## 2019-05-20 DIAGNOSIS — R443 Hallucinations, unspecified: Secondary | ICD-10-CM | POA: Diagnosis not present

## 2019-05-20 DIAGNOSIS — E785 Hyperlipidemia, unspecified: Secondary | ICD-10-CM

## 2019-05-20 DIAGNOSIS — Z7189 Other specified counseling: Secondary | ICD-10-CM

## 2019-05-20 DIAGNOSIS — M542 Cervicalgia: Secondary | ICD-10-CM

## 2019-05-20 DIAGNOSIS — K1379 Other lesions of oral mucosa: Secondary | ICD-10-CM

## 2019-05-20 DIAGNOSIS — Z23 Encounter for immunization: Secondary | ICD-10-CM | POA: Diagnosis not present

## 2019-05-20 DIAGNOSIS — Z0000 Encounter for general adult medical examination without abnormal findings: Secondary | ICD-10-CM

## 2019-05-20 MED ORDER — LIDOCAINE VISCOUS HCL 2 % MT SOLN: 15.0000 mL | Freq: Four times a day (QID) | OROMUCOSAL | 0 refills | Status: DC | PRN

## 2019-05-20 MED ORDER — ROSUVASTATIN CALCIUM 5 MG PO TABS: ORAL_TABLET | ORAL | 3 refills | Status: AC

## 2019-05-20 MED ORDER — ARIPIPRAZOLE 5 MG PO TABS: 2.5000 mg | ORAL_TABLET | Freq: Every day | ORAL | 3 refills | Status: AC

## 2019-05-20 MED ORDER — FUROSEMIDE 40 MG PO TABS: 40.0000 mg | ORAL_TABLET | Freq: Every day | ORAL | 3 refills | Status: DC

## 2019-05-20 MED ORDER — PREDNISONE 10 MG PO TABS: ORAL_TABLET | ORAL | 0 refills | Status: DC

## 2019-05-20 MED ORDER — METOPROLOL TARTRATE 100 MG PO TABS: 50.0000 mg | ORAL_TABLET | Freq: Two times a day (BID) | ORAL | 3 refills | Status: DC

## 2019-05-20 NOTE — Patient Instructions (Addendum)
Flu shot today.    Check with your insurance to see if they will cover the shingrix shot.  Have your family check on the covid vaccine at the site listed below.   [facility]  Use prednisone with food for the neck and shoulder pain.   Let me know if that doesn't help or if your BP stays above 140/90.  Use lidocaine (a small amount) as needed for the gum pain.   Update me as needed.   Take care.  Glad to see you.

## 2019-05-20 NOTE — Progress Notes (Signed)
This visit occurred during the SARS-CoV-2 public health emergency.  Safety protocols were in place, including screening questions prior to the visit, additional usage of staff PPE, and extensive cleaning of exam room while observing appropriate contact time as indicated for disinfecting solutions.  Elevated Cholesterol: Using medications without problems: yes Muscle aches: likely not from statin.   Diet compliance: discussed.   Exercise: limited at baseline due to chronic gait changes.   Shoulder pain.  L trap/neck pain.  Radiates to the L forearm.  Tried aspercreme w/o relief.  Normal grip.  No R sided sx.  No rash.    Hypertension:    Using medication without problems or lightheadedness: yes Chest pain with exertion:no Edema: at baseline.  Short of breath: no  She has some B lower and upper gum pain, with recent neg dental eval per patient report.  She has good denture fit.  Intermittent pain, no ulceration.   Recent memory screen d/w pt.  2/3 recall.  "At times it's good and sometimes I have a little trouble, forgetting what I was going to do, then it comes back."  No red flag events.  No falls.  Still on abilify, compliant.    Flu vaccine- administered Tetanus d/w pt.  PNA up to date.  Shingles d/w pt.   Pap not due. Colonoscopy not due given age. D/w pt.  Mammogram done 2016, d/w pt.  she declined at this point.   DXA declined.  Advance directive- d/w pt. She would have Fortunato Curling and Sharlene Dory equally designated if patient were incapacitated.  She would prefer both to be involved in decision making.    PMH and SH reviewed  Meds, vitals, and allergies reviewed.   ROS: Per HPI unless specifically indicated in ROS section   GEN: nad, alert and oriented HEENT: ncat, MMM, edentulous, no ulceration or erythema on the gumlines.  No buccal irritation.  No tongue lesions. NECK: supple w/o LA CV: rrr. PULM: ctab, no inc wob ABD: soft, +bs EXT: 1-2+ BLE edema at  baseline.  SKIN: no acute rash Symmetric but slow gait at baseline. Normal grip in the bilateral hands.  Normal sensation in the bilateral upper extremities but she does have some left-sided trapezius pain.  Normal shoulder range of motion bilaterally.

## 2019-05-21 DIAGNOSIS — M542 Cervicalgia: Secondary | ICD-10-CM | POA: Insufficient documentation

## 2019-05-21 DIAGNOSIS — K1379 Other lesions of oral mucosa: Secondary | ICD-10-CM | POA: Insufficient documentation

## 2019-05-21 NOTE — Assessment & Plan Note (Signed)
History of.  Controlled with Abilify. Recent memory screen d/w pt.  2/3 recall.  "At times it's good and sometimes I have a little trouble, forgetting what I was going to do, then it comes back."  No red flag events.  No falls.  We can monitor for now.

## 2019-05-21 NOTE — Assessment & Plan Note (Signed)
It sounds like she has radicular symptoms.  Okay to defer imaging at this point as she does not have emergent symptoms or weakness.  Reasonable to try a short course of prednisone with routine cautions and she will update me as needed.  She agrees.  See after visit summary.  Discussed.

## 2019-05-21 NOTE — Assessment & Plan Note (Signed)
I do not see any oral lesions.  She can try topical lidocaine when she has pain to see if that helps.  If it continues then I want her to update me.  She agrees.

## 2019-05-21 NOTE — Assessment & Plan Note (Signed)
Continue statin for now.  I do not think her neck pain is related to statin use.  Labs discussed with patient, she was not fasting on labs and that likely explains her elevated glucose level but lower A1c.

## 2019-05-21 NOTE — Assessment & Plan Note (Signed)
Recheck blood pressure was improved.  I do not want to induce hypotension.  Would continue as is for now.  She agrees.  Continue Lasix for edema.

## 2019-05-21 NOTE — Assessment & Plan Note (Signed)
Advance directive- d/w pt. She would have Kristen Goodman and Kristen Goodman equally designated if patient were incapacitated.  She would prefer both to be involved in decision making.

## 2019-05-21 NOTE — Assessment & Plan Note (Signed)
Flu vaccine- administered Tetanus d/w pt.  PNA up to date.  Shingles d/w pt.   Pap not due. Colonoscopy not due given age. D/w pt.  Mammogram done 2016, d/w pt.  she declined at this point.   DXA declined.  Advance directive- d/w pt. She would have Fortunato Curling and Sharlene Dory equally designated if patient were incapacitated.  She would prefer both to be involved in decision making.

## 2019-07-28 ENCOUNTER — Encounter: Payer: Self-pay | Admitting: Family Medicine

## 2019-07-28 ENCOUNTER — Other Ambulatory Visit: Payer: Self-pay

## 2019-07-28 ENCOUNTER — Ambulatory Visit (INDEPENDENT_AMBULATORY_CARE_PROVIDER_SITE_OTHER): Payer: Medicare Other | Admitting: Family Medicine

## 2019-07-28 VITALS — BP 144/82 | HR 55 | Temp 97.7°F

## 2019-07-28 DIAGNOSIS — R410 Disorientation, unspecified: Secondary | ICD-10-CM

## 2019-07-28 DIAGNOSIS — R441 Visual hallucinations: Secondary | ICD-10-CM | POA: Diagnosis not present

## 2019-07-28 LAB — POCT URINALYSIS DIPSTICK
Bilirubin, UA: NEGATIVE
Blood, UA: NEGATIVE
Glucose, UA: NEGATIVE
Ketones, UA: NEGATIVE
Leukocytes, UA: NEGATIVE
Nitrite, UA: NEGATIVE
Protein, UA: NEGATIVE
Spec Grav, UA: 1.02 (ref 1.010–1.025)
Urobilinogen, UA: 0.2 E.U./dL
pH, UA: 6 (ref 5.0–8.0)

## 2019-07-28 LAB — COMPREHENSIVE METABOLIC PANEL
ALT: 16 U/L (ref 0–35)
AST: 16 U/L (ref 0–37)
Albumin: 4 g/dL (ref 3.5–5.2)
Alkaline Phosphatase: 42 U/L (ref 39–117)
BUN: 17 mg/dL (ref 6–23)
CO2: 27 mEq/L (ref 19–32)
Calcium: 9.6 mg/dL (ref 8.4–10.5)
Chloride: 110 mEq/L (ref 96–112)
Creatinine, Ser: 1.14 mg/dL (ref 0.40–1.20)
GFR: 55.12 mL/min — ABNORMAL LOW (ref >60.00)
Glucose, Bld: 95 mg/dL (ref 70–99)
Potassium: 3.9 mEq/L (ref 3.5–5.1)
Sodium: 142 mEq/L (ref 135–145)
Total Bilirubin: 1.5 mg/dL — ABNORMAL HIGH (ref 0.2–1.2)
Total Protein: 7.3 g/dL (ref 6.0–8.3)

## 2019-07-28 LAB — CBC WITH DIFFERENTIAL/PLATELET
Basophils Absolute: 0 10*3/uL (ref 0.0–0.1)
Basophils Relative: 0.7 % (ref 0.0–3.0)
Eosinophils Absolute: 0.1 10*3/uL (ref 0.0–0.7)
Eosinophils Relative: 2.7 % (ref 0.0–5.0)
HCT: 40.5 % (ref 36.0–46.0)
Hemoglobin: 13.4 g/dL (ref 12.0–15.0)
Lymphocytes Relative: 24.9 % (ref 12.0–46.0)
Lymphs Abs: 1 10*3/uL (ref 0.7–4.0)
MCHC: 33.1 g/dL (ref 30.0–36.0)
MCV: 84.5 fl (ref 78.0–100.0)
Monocytes Absolute: 0.4 10*3/uL (ref 0.1–1.0)
Monocytes Relative: 10.3 % (ref 3.0–12.0)
Neutro Abs: 2.4 10*3/uL (ref 1.4–7.7)
Neutrophils Relative %: 61.4 % (ref 43.0–77.0)
Platelets: 164 10*3/uL (ref 150.0–400.0)
RBC: 4.79 Mil/uL (ref 3.87–5.11)
RDW: 14.5 % (ref 11.5–15.5)
WBC: 3.9 10*3/uL — ABNORMAL LOW (ref 4.0–10.5)

## 2019-07-28 LAB — TSH: TSH: 1.7 u[IU]/mL (ref 0.35–4.50)

## 2019-07-28 NOTE — Progress Notes (Signed)
Subjective:    Patient ID: Kristen Goodman, female    DOB: 11-01-1934, 85 y.o.   MRN: [id number]  HPI Chief Complaint  Patient presents with  . Altered Mental Status    x 2 weeks. Started around the time of her Covid vaccines.  U/a done in office, normal. Aripiprazole 5mg  - given by Psychiatrist last episode and it gother back to her baseline - takes 1/2 tablet normally but Nephew gave her a whole tablet last night but there was no change. Pt Nephew  states that he hears her at night getting up, making comments about seeing things that arent there, "little people walking around the room".  Denies any urinary issues.   This is an 85 yo female who presents today with above cc. She is accompanied by her nephew. He has noticed that she has been having hallucinations over last couple of weeks. Is seeing bugs and people who are not there. She is up and down all night. She has not left the home. Was seen by psychiatry 06/30/2012 for hallucinations. Was put on aripirazole 2.5 mg and hallucinations stopped.  Patient denies chest pain, change in breathing (sometimes feels SOB), she reports intermittent knee pain. She denies feeling worried or afraid. She states that her nephew wants to put her in a medical home with people around. He denies that he wants her to go to a facility.   Review of Systems Per HPI    Objective:   Physical Exam Vitals reviewed.  Constitutional:      General: She is not in acute distress.    Appearance: She is obese. She is not ill-appearing, toxic-appearing or diaphoretic.  HENT:     Head: Normocephalic and atraumatic.     Mouth/Throat:     Mouth: Mucous membranes are moist.     Pharynx: Oropharynx is clear.  Eyes:     Extraocular Movements: Extraocular movements intact.     Conjunctiva/sclera: Conjunctivae normal.     Pupils: Pupils are equal, round, and reactive to light.  Cardiovascular:     Rate and Rhythm: Regular rhythm. Bradycardia present.     Heart  sounds: Normal heart sounds.  Pulmonary:     Effort: Pulmonary effort is normal.     Breath sounds: Normal breath sounds.  Musculoskeletal:     Right lower leg: Edema (1+) present.     Left lower leg: Edema (1+) present.  Skin:    General: Skin is warm and dry.  Neurological:     General: No focal deficit present.     Mental Status: She is alert.     Cranial Nerves: Cranial nerves are intact. No facial asymmetry.     Sensory: Sensation is intact.     Motor: Motor function is intact. No weakness or atrophy.     Comments: Shuffling, steady gait without assistive devices. Answers questions appropriately. No spontaneous conversation. UE/ LE strength 3/4. Moves all four extremities.   Psychiatric:        Mood and Affect: Affect is flat.        Speech: Speech is delayed.        Behavior: Behavior is cooperative.       BP (!) 144/82 (BP Location: Left Arm, Patient Position: Sitting, Cuff Size: Normal)   Pulse (!) 55   Temp 97.7 F (36.5 C) (Temporal)   SpO2 98%      Results for orders placed or performed in visit on 07/28/19  Urinalysis Dipstick  Result Value Ref  Range   Color, UA light yellow    Clarity, UA clear    Glucose, UA Negative Negative   Bilirubin, UA neg    Ketones, UA neg    Spec Grav, UA 1.020 1.010 - 1.025   Blood, UA neg    pH, UA 6.0 5.0 - 8.0   Protein, UA Negative Negative   Urobilinogen, UA 0.2 0.2 or 1.0 E.U./dL   Nitrite, UA neg    Leukocytes, UA Negative Negative   Appearance     Odor      Assessment & Plan:  1. Confusion - discussed with Dr. Damita Dunnings, patient and nephew who is with her today. Will check labs and see if any metabolic disturbance. In the meantime, increase aripiprazole from 2.5 mg to 5 mg nightly. If no abnormal labs, consider psych referral.  - Urinalysis Dipstick - Urine Culture; Future - CBC with Differential - Comprehensive metabolic panel - TSH - Urine Culture  2. Visual hallucinations - as per #1 - Urinalysis  Dipstick - Urine Culture; Future - CBC with Differential - Comprehensive metabolic panel - TSH - Urine Culture   Clarene Reamer, FNP-BC  [facility], [facility]  07/29/2019 12:52 PM

## 2019-07-28 NOTE — Patient Instructions (Signed)
Increase aripirazole to 1 tablet (5 mg)  We will notify you of lab results

## 2019-07-29 ENCOUNTER — Telehealth: Payer: Self-pay | Admitting: Family Medicine

## 2019-07-29 ENCOUNTER — Other Ambulatory Visit: Payer: Self-pay | Admitting: Family Medicine

## 2019-07-29 DIAGNOSIS — R443 Hallucinations, unspecified: Secondary | ICD-10-CM

## 2019-07-29 DIAGNOSIS — M129 Arthropathy, unspecified: Secondary | ICD-10-CM

## 2019-07-29 DIAGNOSIS — R600 Localized edema: Secondary | ICD-10-CM

## 2019-07-29 DIAGNOSIS — R269 Unspecified abnormalities of gait and mobility: Secondary | ICD-10-CM

## 2019-07-29 DIAGNOSIS — R413 Other amnesia: Secondary | ICD-10-CM

## 2019-07-29 LAB — URINE CULTURE
MICRO NUMBER:: 10277043
Result:: NO GROWTH
SPECIMEN QUALITY:: ADEQUATE

## 2019-07-29 NOTE — Telephone Encounter (Signed)
error 

## 2019-07-29 NOTE — Telephone Encounter (Signed)
Patient's nephew,Michael, called.  He's requesting patient get a rx for bedside commode and he's requesting a referral for in home health care and rx for Depends.

## 2019-07-29 NOTE — Telephone Encounter (Signed)
I have sent a result note for patient's nephew regarding labs (unremarkable, still awaiting urine culture results). I have put in home health order and told patient's nephew that I will wait on evaluation for determination of DME needs. I have also offered referral to psychiatry.

## 2019-07-30 ENCOUNTER — Encounter (HOSPITAL_COMMUNITY): Payer: Self-pay | Admitting: *Deleted

## 2019-07-30 ENCOUNTER — Emergency Department (HOSPITAL_COMMUNITY): Payer: Medicare Other

## 2019-07-30 ENCOUNTER — Other Ambulatory Visit: Payer: Self-pay

## 2019-07-30 ENCOUNTER — Inpatient Hospital Stay (HOSPITAL_COMMUNITY)
Admission: EM | Admit: 2019-07-30 | Discharge: 2019-08-01 | DRG: 640 | Disposition: A | Discharge status: Home Health | Payer: Medicare Other | Attending: Internal Medicine | Admitting: Internal Medicine

## 2019-07-30 DIAGNOSIS — R404 Transient alteration of awareness: Secondary | ICD-10-CM

## 2019-07-30 DIAGNOSIS — I10 Essential (primary) hypertension: Secondary | ICD-10-CM | POA: Diagnosis not present

## 2019-07-30 DIAGNOSIS — E870 Hyperosmolality and hypernatremia: Secondary | ICD-10-CM

## 2019-07-30 DIAGNOSIS — R413 Other amnesia: Secondary | ICD-10-CM

## 2019-07-30 DIAGNOSIS — E669 Obesity, unspecified: Secondary | ICD-10-CM

## 2019-07-30 DIAGNOSIS — R001 Bradycardia, unspecified: Secondary | ICD-10-CM

## 2019-07-30 DIAGNOSIS — E785 Hyperlipidemia, unspecified: Secondary | ICD-10-CM

## 2019-07-30 DIAGNOSIS — E860 Dehydration: Secondary | ICD-10-CM | POA: Diagnosis not present

## 2019-07-30 DIAGNOSIS — R7989 Other specified abnormal findings of blood chemistry: Secondary | ICD-10-CM | POA: Diagnosis not present

## 2019-07-30 DIAGNOSIS — F03918 Unspecified dementia, unspecified severity, with other behavioral disturbance: Secondary | ICD-10-CM

## 2019-07-30 DIAGNOSIS — B159 Hepatitis A without hepatic coma: Secondary | ICD-10-CM | POA: Diagnosis present

## 2019-07-30 DIAGNOSIS — F05 Delirium due to known physiological condition: Secondary | ICD-10-CM | POA: Diagnosis not present

## 2019-07-30 DIAGNOSIS — E7220 Disorder of urea cycle metabolism, unspecified: Secondary | ICD-10-CM

## 2019-07-30 DIAGNOSIS — G9341 Metabolic encephalopathy: Secondary | ICD-10-CM

## 2019-07-30 DIAGNOSIS — Z20822 Contact with and (suspected) exposure to covid-19: Secondary | ICD-10-CM | POA: Diagnosis not present

## 2019-07-30 DIAGNOSIS — D1803 Hemangioma of intra-abdominal structures: Secondary | ICD-10-CM | POA: Diagnosis not present

## 2019-07-30 DIAGNOSIS — R443 Hallucinations, unspecified: Secondary | ICD-10-CM

## 2019-07-30 DIAGNOSIS — Z8249 Family history of ischemic heart disease and other diseases of the circulatory system: Secondary | ICD-10-CM

## 2019-07-30 DIAGNOSIS — R945 Abnormal results of liver function studies: Secondary | ICD-10-CM | POA: Diagnosis not present

## 2019-07-30 DIAGNOSIS — F329 Major depressive disorder, single episode, unspecified: Secondary | ICD-10-CM | POA: Diagnosis present

## 2019-07-30 DIAGNOSIS — R4182 Altered mental status, unspecified: Secondary | ICD-10-CM | POA: Diagnosis not present

## 2019-07-30 DIAGNOSIS — F0391 Unspecified dementia with behavioral disturbance: Secondary | ICD-10-CM | POA: Diagnosis not present

## 2019-07-30 DIAGNOSIS — M1710 Unilateral primary osteoarthritis, unspecified knee: Secondary | ICD-10-CM | POA: Diagnosis present

## 2019-07-30 DIAGNOSIS — I4510 Unspecified right bundle-branch block: Secondary | ICD-10-CM | POA: Diagnosis not present

## 2019-07-30 DIAGNOSIS — Z79899 Other long term (current) drug therapy: Secondary | ICD-10-CM

## 2019-07-30 DIAGNOSIS — Z6835 Body mass index (BMI) 35.0-35.9, adult: Secondary | ICD-10-CM

## 2019-07-30 DIAGNOSIS — R42 Dizziness and giddiness: Secondary | ICD-10-CM | POA: Diagnosis not present

## 2019-07-30 LAB — COMPREHENSIVE METABOLIC PANEL
ALT: 20 U/L (ref 0–44)
AST: 19 U/L (ref 15–41)
Albumin: 3.5 g/dL (ref 3.5–5.0)
Alkaline Phosphatase: 33 U/L — ABNORMAL LOW (ref 38–126)
Anion gap: 10 (ref 5–15)
BUN: 14 mg/dL (ref 8–23)
CO2: 23 mmol/L (ref 22–32)
Calcium: 9.1 mg/dL (ref 8.9–10.3)
Chloride: 114 mmol/L — ABNORMAL HIGH (ref 98–111)
Creatinine, Ser: 1.27 mg/dL — ABNORMAL HIGH (ref 0.44–1.00)
GFR calc Af Amer: 46 mL/min — ABNORMAL LOW (ref >60)
GFR calc non Af Amer: 39 mL/min — ABNORMAL LOW (ref >60)
Glucose, Bld: 88 mg/dL (ref 70–99)
Potassium: 3.9 mmol/L (ref 3.5–5.1)
Sodium: 147 mmol/L — ABNORMAL HIGH (ref 135–145)
Total Bilirubin: 1.8 mg/dL — ABNORMAL HIGH (ref 0.3–1.2)
Total Protein: 6.6 g/dL (ref 6.5–8.1)

## 2019-07-30 LAB — CBG MONITORING, ED: Glucose-Capillary: 76 mg/dL (ref 70–99)

## 2019-07-30 LAB — SARS CORONAVIRUS 2 (TAT 6-24 HRS): SARS Coronavirus 2: NEGATIVE

## 2019-07-30 LAB — CBC WITH DIFFERENTIAL/PLATELET
Abs Immature Granulocytes: 0.01 10*3/uL (ref 0.00–0.07)
Basophils Absolute: 0 10*3/uL (ref 0.0–0.1)
Basophils Relative: 1 %
Eosinophils Absolute: 0.2 10*3/uL (ref 0.0–0.5)
Eosinophils Relative: 4 %
HCT: 44.5 % (ref 36.0–46.0)
Hemoglobin: 14 g/dL (ref 12.0–15.0)
Immature Granulocytes: 0 %
Lymphocytes Relative: 29 %
Lymphs Abs: 1 10*3/uL (ref 0.7–4.0)
MCH: 27.6 pg (ref 26.0–34.0)
MCHC: 31.5 g/dL (ref 30.0–36.0)
MCV: 87.6 fL (ref 80.0–100.0)
Monocytes Absolute: 0.3 10*3/uL (ref 0.1–1.0)
Monocytes Relative: 10 %
Neutro Abs: 2 10*3/uL (ref 1.7–7.7)
Neutrophils Relative %: 56 %
Platelets: 127 10*3/uL — ABNORMAL LOW (ref 150–400)
RBC: 5.08 MIL/uL (ref 3.87–5.11)
RDW: 13.6 % (ref 11.5–15.5)
WBC: 3.6 10*3/uL — ABNORMAL LOW (ref 4.0–10.5)
nRBC: 0 % (ref 0.0–0.2)

## 2019-07-30 LAB — AMMONIA
Ammonia: 63 umol/L — ABNORMAL HIGH (ref 9–35)
Ammonia: 49 umol/L — ABNORMAL HIGH (ref 9–35)

## 2019-07-30 LAB — URINALYSIS, ROUTINE W REFLEX MICROSCOPIC
Bilirubin Urine: NEGATIVE
Glucose, UA: NEGATIVE mg/dL
Hgb urine dipstick: NEGATIVE
Ketones, ur: NEGATIVE mg/dL
Leukocytes,Ua: NEGATIVE
Nitrite: NEGATIVE
Protein, ur: NEGATIVE mg/dL
Specific Gravity, Urine: 1.009 (ref 1.005–1.030)
pH: 7 (ref 5.0–8.0)

## 2019-07-30 LAB — TSH: TSH: 1.645 u[IU]/mL (ref 0.350–4.500)

## 2019-07-30 LAB — GLUCOSE, CAPILLARY: Glucose-Capillary: 132 mg/dL — ABNORMAL HIGH (ref 70–99)

## 2019-07-30 MED ORDER — METOPROLOL TARTRATE 25 MG PO TABS
50.0000 mg | ORAL_TABLET | Freq: Two times a day (BID) | ORAL | Status: DC
  Filled 2019-07-30: qty 2

## 2019-07-30 MED ORDER — ROSUVASTATIN CALCIUM 5 MG PO TABS
5.0000 mg | ORAL_TABLET | Freq: Every day | ORAL | Status: DC
  Administered 2019-07-30: 5 mg via ORAL
  Filled 2019-07-30 (×2): qty 1

## 2019-07-30 MED ORDER — ARIPIPRAZOLE 10 MG PO TABS
5.0000 mg | ORAL_TABLET | Freq: Every day | ORAL | Status: DC
  Administered 2019-07-30 – 2019-08-01 (×3): 5 mg via ORAL
  Filled 2019-07-30 (×3): qty 1

## 2019-07-30 MED ORDER — ACETAMINOPHEN 650 MG RE SUPP: 650.0000 mg | Freq: Four times a day (QID) | RECTAL | Status: DC | PRN

## 2019-07-30 MED ORDER — LACTULOSE 10 GM/15ML PO SOLN
10.0000 g | Freq: Once | ORAL | Status: AC
  Administered 2019-07-30: 10 g via ORAL
  Filled 2019-07-30: qty 15

## 2019-07-30 MED ORDER — ACETAMINOPHEN 325 MG PO TABS
650.0000 mg | ORAL_TABLET | Freq: Four times a day (QID) | ORAL | Status: DC | PRN
  Administered 2019-07-31: 18:00:00 650 mg via ORAL
  Filled 2019-07-30: qty 2

## 2019-07-30 MED ORDER — HYDRALAZINE HCL 25 MG PO TABS: 25.0000 mg | ORAL_TABLET | Freq: Four times a day (QID) | ORAL | Status: DC | PRN

## 2019-07-30 MED ORDER — DOCUSATE SODIUM 100 MG PO CAPS: 100.0000 mg | ORAL_CAPSULE | Freq: Two times a day (BID) | ORAL | Status: DC | PRN

## 2019-07-30 MED ORDER — FUROSEMIDE 40 MG PO TABS
40.0000 mg | ORAL_TABLET | Freq: Every day | ORAL | Status: DC
  Administered 2019-07-30: 40 mg via ORAL
  Filled 2019-07-30: qty 2

## 2019-07-30 MED ORDER — IOHEXOL 300 MG/ML  SOLN
80.0000 mL | Freq: Once | INTRAMUSCULAR | Status: AC | PRN
  Administered 2019-07-30: 80 mL via INTRAVENOUS

## 2019-07-30 MED ORDER — SODIUM CHLORIDE 0.9 % IV BOLUS
500.0000 mL | Freq: Once | INTRAVENOUS | Status: AC
  Administered 2019-07-30: 500 mL via INTRAVENOUS

## 2019-07-30 MED ORDER — VITAMIN D 25 MCG (1000 UNIT) PO TABS
2000.0000 [IU] | ORAL_TABLET | Freq: Every day | ORAL | Status: DC
  Administered 2019-07-30 – 2019-08-01 (×3): 2000 [IU] via ORAL
  Filled 2019-07-30 (×3): qty 2

## 2019-07-30 MED ORDER — AMLODIPINE BESYLATE 5 MG PO TABS
5.0000 mg | ORAL_TABLET | Freq: Every day | ORAL | Status: DC
  Administered 2019-07-30 – 2019-08-01 (×3): 5 mg via ORAL
  Filled 2019-07-30 (×3): qty 1

## 2019-07-30 MED ORDER — FLUTICASONE PROPIONATE 50 MCG/ACT NA SUSP
2.0000 | Freq: Every day | NASAL | Status: DC
  Filled 2019-07-30: qty 16

## 2019-07-30 MED ORDER — ZOLPIDEM TARTRATE 5 MG PO TABS
5.0000 mg | ORAL_TABLET | Freq: Every day | ORAL | Status: DC
  Administered 2019-07-30: 22:00:00 5 mg via ORAL
  Filled 2019-07-30: qty 1

## 2019-07-30 MED ORDER — ENOXAPARIN SODIUM 40 MG/0.4ML ~~LOC~~ SOLN
40.0000 mg | SUBCUTANEOUS | Status: DC
  Administered 2019-07-30 – 2019-08-01 (×3): 40 mg via SUBCUTANEOUS
  Filled 2019-07-30 (×3): qty 0.4

## 2019-07-30 NOTE — Telephone Encounter (Signed)
[facility] Night - Client Nonclinical Telephone Record AccessNurse Client [facility] Night - Client Client Site [place] Physician Tor Netters- NP Contact Type Call Who Is Calling Patient / Member / Family / Caregiver Caller Name Sharlene Dory Caller Phone Number [phone] Call Type Message Only Information Provided Reason for Call Returning a Call from the Office Initial [place] stating he is returning a call to the office on behalf of her Aunt that was seen yesterday. Additional Comment Deshia Cruckfield is Aunt and he was returning the call to the office. Please call back at your earliest convenience. Thank You. Disp. Time Disposition Final User 07/29/2019 5:01:36 PM General Information Provided Yes Rosalin Hawking Call Closed By: Rosalin Hawking Transaction Date/Time: 07/29/2019 4:58:38 PM (ET)

## 2019-07-30 NOTE — ED Provider Notes (Signed)
[facility] EMERGENCY DEPARTMENT Provider Note   CSN: [id number] Arrival date & time: 01-Aug-2019  [id number]     History Chief Complaint  Patient presents with  . Altered Mental Status    Tiana AMALIAH STEINBECK is a 85 y.o. female.  Pt presents to the ED today with AMS.  History obtained via EMS.  For the last 3 weeks, she has been very confused.  This is worse at night.  She becomes confused and takes off all of her clothes.  Her nephew lives with her at her home.  This morning, she was not speaking, so he called EMS.  En route, pt started speaking and is now oriented.  She initially did not remember what happened this am, but now does.  Pt has seen her pcp for this problem.  On 3/22, her pcp increased her aripiprazole from 0.5 mg to 1 mg.  The pt denies any sob or cp.  No f/c.  She did receive her 2nd Covid vaccine on 3/16.    Pt's daughter said pt had a "nervous breakdown" in 2008-07-31 when her husband died and this is similar to how she acted then.        Past Medical History:  Diagnosis Date  . Arthritis    knee  . Hyperlipidemia 03/09/1995  . Hypertension 05/08/1978  . Memory changes    with hallucinations    Patient Active Problem List   Diagnosis Date Noted  . Oral pain 05/21/2019  . Neck pain 05/21/2019  . Muscle pain 03/21/2017  . Healthcare maintenance 03/15/2016  . Advance care planning 01/24/2015  . Syncope 11/25/2014  . LBP (low back pain) 05/30/2013  . Obesity, unspecified 12/24/2012  . Medicare annual wellness visit, subsequent 06/02/2012  . Hallucinations 01/03/2012  . Memory changes 12/25/2011  . Shoulder pain 11/27/2011  . Renal insufficiency 11/23/2010  . History of gout 02/28/2010  . Vitamin D deficiency 03/10/2009  . 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 Hyperglycemia 06/08/2001  . Elevated lipids 03/09/1995  . Essential hypertension 05/08/1978    Past Surgical History:  Procedure Laterality Date  . ABDOMINAL HYSTERECTOMY   07/04/1985   HRT s/p partial hyst for dysmenorrhea/ menopause  . CATARACT EXTRACTION Bilateral      OB History   No obstetric history on file.     Family History  Problem Relation Age of Onset  . Hypertension Mother   . Diabetes Father   . Hypertension Father   . Heart disease Sister   . Diabetes Brother   . Diabetes Brother   . Cancer Neg Hx   . Alcohol abuse Neg Hx   . Depression Neg Hx   . Breast cancer Neg Hx   . Colon cancer Neg Hx     Social History   Tobacco Use  . Smoking status: Never Smoker  . Smokeless tobacco: Never Used  Substance Use Topics  . Alcohol use: No    Alcohol/week: 0.0 standard drinks  . Drug use: No    Home Medications Prior to Admission medications   Medication Sig Start Date End Date Taking? Authorizing Provider  acetaminophen (TYLENOL) 500 MG tablet Take 500 mg by mouth as needed.      [provider]  ARIPiprazole (ABILIFY) 5 MG tablet Take 0.5 tablets (2.5 mg total) by mouth daily. Patient taking differently: Take 5 mg by mouth daily.  05/20/19   Tonia Ghent, MD  Cholecalciferol 2000 units TABS Take 1 tablet (2,000 Units total) by mouth  daily. 03/14/16   Tonia Ghent, MD  fluticasone Asencion Islam) 50 MCG/ACT nasal spray Place 2 sprays into both nostrils daily. 10/23/17   Tonia Ghent, MD  furosemide (LASIX) 40 MG tablet Take 1 tablet (40 mg total) by mouth daily. 05/20/19   Tonia Ghent, MD  lidocaine (XYLOCAINE) 2 % solution Use as directed 15 mLs in the mouth or throat every 6 (six) hours as needed for mouth pain (use a small amount). 05/20/19   Tonia Ghent, MD  metoprolol tartrate (LOPRESSOR) 100 MG tablet Take 0.5 tablets (50 mg total) by mouth 2 (two) times daily. 05/20/19   Tonia Ghent, MD  predniSONE (DELTASONE) 10 MG tablet Take 2 a day for 5 days, then 1 a day for 5 days, with food. Don't take with aleve/ibuprofen. 05/20/19   Tonia Ghent, MD  rosuvastatin (CRESTOR) 5 MG tablet TAKE 1 TABLET BY MOUTH  EVERY DAY 05/20/19   Tonia Ghent, MD    Allergies    Risperdal [risperidone]  Review of Systems   Review of Systems  Psychiatric/Behavioral: Positive for confusion.  All other systems reviewed and are negative.   Physical Exam Updated Vital Signs Pulse (!) 55   Temp 98.5 F (36.9 C)   Resp (!) 22   Ht 5' (1.524 m)   Wt 82.1 kg   SpO2 99%   BMI 35.35 kg/m   Physical Exam Vitals and nursing note reviewed.  Constitutional:      Appearance: Normal appearance.  HENT:     Head: Normocephalic and atraumatic.     Right Ear: External ear normal.     Left Ear: External ear normal.     Nose: Nose normal.     Mouth/Throat:     Mouth: Mucous membranes are moist.     Pharynx: Oropharynx is clear.  Eyes:     Extraocular Movements: Extraocular movements intact.     Conjunctiva/sclera: Conjunctivae normal.     Pupils: Pupils are equal, round, and reactive to light.  Cardiovascular:     Rate and Rhythm: Normal rate and regular rhythm.     Pulses: Normal pulses.     Heart sounds: Normal heart sounds.  Pulmonary:     Effort: Pulmonary effort is normal.     Breath sounds: Normal breath sounds.  Abdominal:     General: Abdomen is flat. Bowel sounds are normal.     Palpations: Abdomen is soft.  Musculoskeletal:        General: Normal range of motion.     Cervical back: Neck supple.  Skin:    General: Skin is warm.     Capillary Refill: Capillary refill takes less than 2 seconds.  Neurological:     General: No focal deficit present.     Mental Status: She is alert and oriented to person, place, and time.  Psychiatric:        Mood and Affect: Mood normal.        Behavior: Behavior normal.        Thought Content: Thought content normal.        Judgment: Judgment normal.     ED Results / Procedures / Treatments   Labs (all labs ordered are listed, but only abnormal results are displayed) Labs Reviewed  CBC WITH DIFFERENTIAL/PLATELET - Abnormal; Notable for the  following components:      Result Value   WBC 3.6 (*)    Platelets 127 (*)    All other components within normal  limits  COMPREHENSIVE METABOLIC PANEL - Abnormal; Notable for the following components:   Sodium 147 (*)    Chloride 114 (*)    Creatinine, Ser 1.27 (*)    Alkaline Phosphatase 33 (*)    Total Bilirubin 1.8 (*)    GFR calc non Af Amer 39 (*)    GFR calc Af Amer 46 (*)    All other components within normal limits  URINALYSIS, ROUTINE W REFLEX MICROSCOPIC - Abnormal; Notable for the following components:   Color, Urine STRAW (*)    All other components within normal limits  AMMONIA - Abnormal; Notable for the following components:   Ammonia 63 (*)    All other components within normal limits  SARS CORONAVIRUS 2 (TAT 6-24 HRS)  HEPATITIS PANEL, ACUTE  CBG MONITORING, ED    EKG EKG Interpretation  Date/Time:  Wednesday July 30 2019 08:13:15 EDT Ventricular Rate:  55 PR Interval:    QRS Duration: 141 QT Interval:  447 QTC Calculation: 428 R Axis:   -33 Text Interpretation: Sinus rhythm Probable left atrial enlargement Right bundle branch block Left ventricular hypertrophy No significant change since last tracing Confirmed by Isla Pence [phone]) on 07/30/2019 9:27:53 AM   Radiology DG Chest 2 View  Result Date: 07/30/2019 CLINICAL DATA:  Dizziness and bilateral leg weakness for 2 days. EXAM: CHEST - 2 VIEW COMPARISON:  04/06/2014. FINDINGS: Trachea is midline. Heart is mildly enlarged. Linear atelectasis or scarring in the left lung base. Lungs are otherwise clear. No pleural fluid. Degenerative changes in the spine and shoulders. IMPRESSION: No acute findings. Electronically Signed   By: Lorin Picket M.D.   On: 07/30/2019 09:47   CT Head Wo Contrast  Result Date: 07/30/2019 CLINICAL DATA:  Altered mental status (AMS), unclear cause. EXAM: CT HEAD WITHOUT CONTRAST TECHNIQUE: Contiguous axial images were obtained from the base of the skull through the vertex  without intravenous contrast. COMPARISON:  Head CT 11/25/2014 FINDINGS: Brain: There is no evidence of acute intracranial hemorrhage, intracranial mass, midline shift or extra-axial fluid collection.No demarcated cortical infarction. Mild chronic small vessel ischemic changes are similar to prior exam 11/25/2014. Stable, mild generalized parenchymal atrophy. Vascular: No hyperdense vessel.  Atherosclerotic calcifications. Skull: Normal. Negative for fracture or focal lesion. Sinuses/Orbits: Visualized orbits demonstrate no acute abnormality. Trace ethmoid sinus mucosal thickening. No significant mastoid effusion. IMPRESSION: No evidence of acute intracranial abnormality. Stable, mild generalized parenchymal atrophy and chronic small vessel ischemic disease. Electronically Signed   By: Kellie Simmering DO   On: 07/30/2019 11:02    Procedures Procedures (including critical care time)  Medications Ordered in ED Medications  lactulose (CHRONULAC) 10 GM/15ML solution 10 g (has no administration in time range)  sodium chloride 0.9 % bolus 500 mL (0 mLs Intravenous Stopped 07/30/19 1114)    ED Course  I have reviewed the triage vital signs and the nursing notes.  Pertinent labs & imaging results that were available during my care of the patient were reviewed by me and considered in my medical decision making (see chart for details).    MDM Rules/Calculators/A&P                      Ammonia elevated and LFTs slightly elevated.  Per family, pt does not drink or have any hx of liver disease.  CT abd/pelvis ordered as well as hepatitis panel.  As this is new for pt, it could be the cause of her confusion as opposed to primarily psychiatric.  I ordered lactulose  Pt d/w Dr. Roosevelt Locks ([facility]) for admission.  Final Clinical Impression(s) / ED Diagnoses Final diagnoses:  Altered mental status, unspecified altered mental status type  Increased ammonia level  Abnormal LFTs  Hypernatremia    Rx / DC  Orders ED Discharge Orders    None       Isla Pence, MD 07/30/19 1242

## 2019-07-30 NOTE — Progress Notes (Signed)
Pt observed to be confused, aggressive and hitting on staff, refused to stay in bed, NP Sharlet Salina paged and notified by pt's RN Hin, ordered to begin restraints, Pt's daughter Hannah Beat) also called and notified of pt's status and the use of restraints, She verbalized to me that pt has been behaving like this for a few days now, confused and aggressive mostly at night time, that was why pt was brought to the hospital, she was however appreciative for being informed. Obasogie-Asidi, Renley Banwart Efe

## 2019-07-30 NOTE — Progress Notes (Signed)
Nurse reported patient heart rate running 45-50, will discontinue metoprolol, start amlodipine 5 mg, plus as needed hydralazine.

## 2019-07-30 NOTE — Progress Notes (Addendum)
Pt received ambien at 2202. Then pt wanted to go to bathroom. Came back to bed. Then pt stated SOB and "Im not feeling well." Then became less responsive with eyes blinking and some lips movement.  Rapid nurse call. NP notified with no new order. 10:40: Pt trying to get out of bed and grab nurse's left wrist very tight  and her other hand on my pocket uniform tight pulling me toward her and was very aggressive like she's about to  punch me. Pt has been trying get out bed multiple times and not answering any questions but choose to talk when she wants to. Veterinary surgeon. 11: Restraint order. 11:20 pm  Restraint on. Then Pt tryingto get out bed and siting up in bed so I call for Phil (charge nurse) who  just walk out of the room  And pt stated looking at the nurse straight in the eyes, "you are dead. Abbe Amsterdam are gone." Removed the call light cord out of the way in case pt use it to swing,  pt stated " you trying to hang me." "Come with me on a train." "okay, you can tied good' referring to the restraint while trying to get her arms out of the restraint.

## 2019-07-30 NOTE — ED Triage Notes (Addendum)
Pt here via [facility] from home for increasing mental status changes.  Over that last 3 weeks, at night time, she will be confused - such as shedding her clothes and sitting on bed naked, muttering to herself.  This morning she woke up and she wasn't speaking, so family they called 911.  Pt was confused initially, but en-route she began to speak clearly and was ao x 4.  Pt received her 2nd Covid shot last Tuesday.

## 2019-07-30 NOTE — H&P (Signed)
History and Physical    Kristen Goodman [id number] DOB: 1934-11-27 DOA: 07/30/2019  PCP: Tonia Ghent, MD   Patient coming from: Home  I have personally briefly reviewed patient's old medical records in [place]  Chief Complaint: AMS  HPI: Kristen Goodman is a 85 y.o. female with medical history significant of depression, HTN, presented with AMS.  Patient was very sleepy, most of history obtained via her nephew Kristen Goodman at bedside.  Patient has a remote history of visual hallucination/depression started about 10 years ago when her husband passed away.  And since then patient has been on Abilify by psychiatrist, and remained stable condition wise, her baseline is able to take care of self including dressing and taking bath, she walks independently.  Patient received her second COVID-19 vaccination on March 16.  Since then, patient has had disturbed sleep cycle, sleepy at daytime and kept awake at night.   Had had several episodes of visual hallucination in the evenings witnessed by daughter and nephew.  Yesterday patient claimed there was some phone nurse hiding in the cabinet, and she stripped her close and sitting naked on the bed.  This morning, she stopped speaking, so nephew called EMS.  En route, pt started speaking and was oriented upon arrival.  On 3/22, nephew took her to pcp increased her aripiprazole from 0.5 mg to 1 mg.  The pt denies any pain, sob or cp.  No f/c.   ED Course: CT head negative, blood work showed elevated ammonia level, UA showed no signs of UTI.  Review of Systems: Not able to perform, patient was sleepy.   Past Medical History:  Diagnosis Date  . Arthritis    knee  . Hyperlipidemia 03/09/1995  . Hypertension 05/08/1978  . Memory changes    with hallucinations    Past Surgical History:  Procedure Laterality Date  . ABDOMINAL HYSTERECTOMY  07/04/1985   HRT s/p partial hyst for dysmenorrhea/ menopause  . CATARACT EXTRACTION Bilateral        reports that she has never smoked. She has never used smokeless tobacco. She reports that she does not drink alcohol or use drugs.  Allergies  Allergen Reactions  . Risperdal [Risperidone]     Worsening hallucinations.     Family History  Problem Relation Age of Onset  . Hypertension Mother   . Diabetes Father   . Hypertension Father   . Heart disease Sister   . Diabetes Brother   . Diabetes Brother   . Cancer Neg Hx   . Alcohol abuse Neg Hx   . Depression Neg Hx   . Breast cancer Neg Hx   . Colon cancer Neg Hx      Prior to Admission medications   Medication Sig Start Date End Date Taking? Authorizing Provider  acetaminophen (TYLENOL) 500 MG tablet Take 500 mg by mouth as needed.      [provider]  ARIPiprazole (ABILIFY) 5 MG tablet Take 0.5 tablets (2.5 mg total) by mouth daily. Patient taking differently: Take 5 mg by mouth daily.  05/20/19   Tonia Ghent, MD  Cholecalciferol 2000 units TABS Take 1 tablet (2,000 Units total) by mouth daily. 03/14/16   Tonia Ghent, MD  fluticasone (FLONASE) 50 MCG/ACT nasal spray Place 2 sprays into both nostrils daily. 10/23/17   Tonia Ghent, MD  furosemide (LASIX) 40 MG tablet Take 1 tablet (40 mg total) by mouth daily. 05/20/19   Tonia Ghent, MD  lidocaine (  XYLOCAINE) 2 % solution Use as directed 15 mLs in the mouth or throat every 6 (six) hours as needed for mouth pain (use a small amount). 05/20/19   Tonia Ghent, MD  metoprolol tartrate (LOPRESSOR) 100 MG tablet Take 0.5 tablets (50 mg total) by mouth 2 (two) times daily. 05/20/19   Tonia Ghent, MD  predniSONE (DELTASONE) 10 MG tablet Take 2 a day for 5 days, then 1 a day for 5 days, with food. Don't take with aleve/ibuprofen. 05/20/19   Tonia Ghent, MD  rosuvastatin (CRESTOR) 5 MG tablet TAKE 1 TABLET BY MOUTH EVERY DAY 05/20/19   Tonia Ghent, MD    Physical Exam: Vitals:   07/30/19 0822 07/30/19 0823  Pulse:  (!) 55  Resp:  (!) 22   Temp:  98.5 F (36.9 C)  SpO2:  99%  Weight: 82.1 kg   Height: 5' (1.524 m)     Constitutional: NAD, calm, comfortable Vitals:   07/30/19 0822 07/30/19 0823  Pulse:  (!) 55  Resp:  (!) 22  Temp:  98.5 F (36.9 C)  SpO2:  99%  Weight: 82.1 kg   Height: 5' (1.524 m)    Eyes: PERRL, lids and conjunctivae normal ENMT: Mucous membranes are moist. Posterior pharynx clear of any exudate or lesions.Normal dentition.  Neck: normal, supple, no masses, no thyromegaly Respiratory: clear to auscultation bilaterally, no wheezing, no crackles. Normal respiratory effort. No accessory muscle use.  Cardiovascular: Regular rate and rhythm, no murmurs / rubs / gallops. No extremity edema. 2+ pedal pulses. No carotid bruits.  Abdomen: no tenderness, no masses palpated. No hepatosplenomegaly. Bowel sounds positive.  Musculoskeletal: no clubbing / cyanosis. No joint deformity upper and lower extremities. Good ROM, no contractures. Normal muscle tone.  Skin: no rashes, lesions, ulcers. No induration Neurologic: Following simple commands, moving all limbs, no significant weakness, no facial droop Psychiatric: Oriented x 3.  Sleepy.    Labs on Admission: I have personally reviewed following labs and imaging studies  CBC: Recent Labs  Lab 07/28/19 1317 07/30/19 0840  WBC 3.9* 3.6*  NEUTROABS 2.4 2.0  HGB 13.4 14.0  HCT 40.5 44.5  MCV 84.5 87.6  PLT 164.0 [id number]   Basic Metabolic Panel: Recent Labs  Lab 07/28/19 1317 07/30/19 0840  NA 142 147*  K 3.9 3.9  CL 110 114*  CO2 27 23  GLUCOSE 95 88  BUN 17 14  CREATININE 1.14 1.27*  CALCIUM 9.6 9.1   GFR: Estimated Creatinine Clearance: 32.4 mL/min (A) (by C-G formula based on SCr of 1.27 mg/dL (H)). Liver Function Tests: Recent Labs  Lab 07/28/19 1317 07/30/19 0840  AST 16 19  ALT 16 20  ALKPHOS 42 33*  BILITOT 1.5* 1.8*  PROT 7.3 6.6  ALBUMIN 4.0 3.5   No results for input(s): LIPASE, AMYLASE in the last 168 hours. Recent  Labs  Lab 07/30/19 0850  AMMONIA 63*   Coagulation Profile: No results for input(s): INR, PROTIME in the last 168 hours. Cardiac Enzymes: No results for input(s): CKTOTAL, CKMB, CKMBINDEX, TROPONINI in the last 168 hours. BNP (last 3 results) No results for input(s): PROBNP in the last 8760 hours. HbA1C: No results for input(s): HGBA1C in the last 72 hours. CBG: Recent Labs  Lab 07/30/19 0817  GLUCAP 76   Lipid Profile: No results for input(s): CHOL, HDL, LDLCALC, TRIG, CHOLHDL, LDLDIRECT in the last 72 hours. Thyroid Function Tests: Recent Labs    07/28/19 1317  TSH 1.70  Anemia Panel: No results for input(s): VITAMINB12, FOLATE, FERRITIN, TIBC, IRON, RETICCTPCT in the last 72 hours. Urine analysis:    Component Value Date/Time   COLORURINE STRAW (A) 07/30/2019 0842   APPEARANCEUR CLEAR 07/30/2019 0842   LABSPEC 1.009 07/30/2019 0842   PHURINE 7.0 07/30/2019 0842   GLUCOSEU NEGATIVE 07/30/2019 0842   HGBUR NEGATIVE 07/30/2019 0842   BILIRUBINUR NEGATIVE 07/30/2019 0842   BILIRUBINUR neg 07/28/2019 1222   KETONESUR NEGATIVE 07/30/2019 0842   PROTEINUR NEGATIVE 07/30/2019 0842   UROBILINOGEN 0.2 07/28/2019 1222   NITRITE NEGATIVE 07/30/2019 0842   LEUKOCYTESUR NEGATIVE 07/30/2019 0842    Radiological Exams on Admission: DG Chest 2 View  Result Date: 07/30/2019 CLINICAL DATA:  Dizziness and bilateral leg weakness for 2 days. EXAM: CHEST - 2 VIEW COMPARISON:  04/06/2014. FINDINGS: Trachea is midline. Heart is mildly enlarged. Linear atelectasis or scarring in the left lung base. Lungs are otherwise clear. No pleural fluid. Degenerative changes in the spine and shoulders. IMPRESSION: No acute findings. Electronically Signed   By: Lorin Picket M.D.   On: 07/30/2019 09:47   CT Head Wo Contrast  Result Date: 07/30/2019 CLINICAL DATA:  Altered mental status (AMS), unclear cause. EXAM: CT HEAD WITHOUT CONTRAST TECHNIQUE: Contiguous axial images were obtained from the  base of the skull through the vertex without intravenous contrast. COMPARISON:  Head CT 11/25/2014 FINDINGS: Brain: There is no evidence of acute intracranial hemorrhage, intracranial mass, midline shift or extra-axial fluid collection.No demarcated cortical infarction. Mild chronic small vessel ischemic changes are similar to prior exam 11/25/2014. Stable, mild generalized parenchymal atrophy. Vascular: No hyperdense vessel.  Atherosclerotic calcifications. Skull: Normal. Negative for fracture or focal lesion. Sinuses/Orbits: Visualized orbits demonstrate no acute abnormality. Trace ethmoid sinus mucosal thickening. No significant mastoid effusion. IMPRESSION: No evidence of acute intracranial abnormality. Stable, mild generalized parenchymal atrophy and chronic small vessel ischemic disease. Electronically Signed   By: Kellie Simmering DO   On: 07/30/2019 11:02    EKG: Independently reviewed.  Sinus bradycardia  Assessment/Plan Active Problems:   Hallucinations   AMS (altered mental status)  Visual hallucination No significant organic etiology identified so far, except elevated of ammonium level, CT abdomen underway, nephew Kristen Goodman reported patient has no history of hepatitis or drinking problem in her past. Avoid constipation, repeat ammonia level tomorrow. Another possible differential, considered to related to disturbed sleep hygiene, since her AMS happened after she received a second dosage of COVID-19 vaccination, and there is of return of her sleep cycle.  We will keep her Abilify dosage, add at bedtime Ambien. Discussed with patient nephew regarding screening for early dementia, retinopathy patient is very sleepy, to perform a Mini-Mental test, consider psych and/or neuro evaluation if patient mental status/extension not improving. Check TSH  Sinus bradycardia And denies any lightheaded, no blurry vision, no palpitations, will continue to monitor.  Hypertension Fairly controlled, continue  metoprolol and Lasix  HLD Statin    DVT prophylaxis: Lovenox Code Status: Full code Family Communication: Nephew Kristen Goodman at bedside Disposition Plan: Probably can go home in the next 24 to 48 hours once her mentation improved. Consults called: None Admission status: Telemetry observation   Lequita Halt MD [facility] Pager [phone]    07/30/2019, 12:59 PM

## 2019-07-30 NOTE — Telephone Encounter (Signed)
Noted.  Thanks.  I will await the ER notes.

## 2019-07-30 NOTE — Telephone Encounter (Signed)
Per chart review tab pt is at [facility] ED with altered mental status. FYI to Dr Damita Dunnings.

## 2019-07-31 ENCOUNTER — Other Ambulatory Visit: Payer: Self-pay | Admitting: Family Medicine

## 2019-07-31 DIAGNOSIS — R4182 Altered mental status, unspecified: Secondary | ICD-10-CM | POA: Diagnosis not present

## 2019-07-31 DIAGNOSIS — E870 Hyperosmolality and hypernatremia: Secondary | ICD-10-CM

## 2019-07-31 DIAGNOSIS — Z6835 Body mass index (BMI) 35.0-35.9, adult: Secondary | ICD-10-CM | POA: Diagnosis not present

## 2019-07-31 DIAGNOSIS — G9341 Metabolic encephalopathy: Secondary | ICD-10-CM | POA: Diagnosis not present

## 2019-07-31 DIAGNOSIS — B159 Hepatitis A without hepatic coma: Secondary | ICD-10-CM

## 2019-07-31 DIAGNOSIS — R945 Abnormal results of liver function studies: Secondary | ICD-10-CM

## 2019-07-31 DIAGNOSIS — Z79899 Other long term (current) drug therapy: Secondary | ICD-10-CM | POA: Diagnosis not present

## 2019-07-31 DIAGNOSIS — I10 Essential (primary) hypertension: Secondary | ICD-10-CM | POA: Diagnosis not present

## 2019-07-31 DIAGNOSIS — Z20822 Contact with and (suspected) exposure to covid-19: Secondary | ICD-10-CM | POA: Diagnosis present

## 2019-07-31 DIAGNOSIS — R410 Disorientation, unspecified: Secondary | ICD-10-CM

## 2019-07-31 DIAGNOSIS — R001 Bradycardia, unspecified: Secondary | ICD-10-CM | POA: Diagnosis not present

## 2019-07-31 DIAGNOSIS — D1803 Hemangioma of intra-abdominal structures: Secondary | ICD-10-CM | POA: Diagnosis present

## 2019-07-31 DIAGNOSIS — Z8249 Family history of ischemic heart disease and other diseases of the circulatory system: Secondary | ICD-10-CM | POA: Diagnosis not present

## 2019-07-31 DIAGNOSIS — E860 Dehydration: Secondary | ICD-10-CM | POA: Diagnosis present

## 2019-07-31 DIAGNOSIS — F329 Major depressive disorder, single episode, unspecified: Secondary | ICD-10-CM | POA: Diagnosis present

## 2019-07-31 DIAGNOSIS — G301 Alzheimer's disease with late onset: Secondary | ICD-10-CM | POA: Diagnosis not present

## 2019-07-31 DIAGNOSIS — R443 Hallucinations, unspecified: Secondary | ICD-10-CM | POA: Diagnosis not present

## 2019-07-31 DIAGNOSIS — F0391 Unspecified dementia with behavioral disturbance: Secondary | ICD-10-CM | POA: Diagnosis present

## 2019-07-31 DIAGNOSIS — F0281 Dementia in other diseases classified elsewhere with behavioral disturbance: Secondary | ICD-10-CM | POA: Diagnosis not present

## 2019-07-31 DIAGNOSIS — M1710 Unilateral primary osteoarthritis, unspecified knee: Secondary | ICD-10-CM | POA: Diagnosis present

## 2019-07-31 DIAGNOSIS — E7220 Disorder of urea cycle metabolism, unspecified: Secondary | ICD-10-CM | POA: Diagnosis present

## 2019-07-31 DIAGNOSIS — F05 Delirium due to known physiological condition: Secondary | ICD-10-CM | POA: Diagnosis present

## 2019-07-31 DIAGNOSIS — E669 Obesity, unspecified: Secondary | ICD-10-CM | POA: Diagnosis not present

## 2019-07-31 DIAGNOSIS — E785 Hyperlipidemia, unspecified: Secondary | ICD-10-CM | POA: Diagnosis not present

## 2019-07-31 LAB — RAPID URINE DRUG SCREEN, HOSP PERFORMED
Amphetamines: NOT DETECTED
Barbiturates: NOT DETECTED
Benzodiazepines: NOT DETECTED
Cocaine: NOT DETECTED
Opiates: NOT DETECTED
Tetrahydrocannabinol: NOT DETECTED

## 2019-07-31 LAB — HEPATITIS PANEL, ACUTE
HCV Ab: NONREACTIVE
Hep A IgM: REACTIVE — AB
Hep B C IgM: NONREACTIVE
Hepatitis B Surface Ag: NONREACTIVE

## 2019-07-31 LAB — TROPONIN I (HIGH SENSITIVITY): Troponin I (High Sensitivity): 38 ng/L — ABNORMAL HIGH (ref <18)

## 2019-07-31 MED ORDER — DOCUSATE SODIUM 100 MG PO CAPS
100.0000 mg | ORAL_CAPSULE | Freq: Two times a day (BID) | ORAL | Status: DC
  Administered 2019-07-31 – 2019-08-01 (×3): 100 mg via ORAL
  Filled 2019-07-31 (×3): qty 1

## 2019-07-31 MED ORDER — DICLOFENAC SODIUM 1 % EX GEL
2.0000 g | Freq: Four times a day (QID) | CUTANEOUS | Status: DC
  Administered 2019-07-31 – 2019-08-01 (×5): 2 g via TOPICAL
  Filled 2019-07-31: qty 100

## 2019-07-31 MED ORDER — TRAZODONE HCL 50 MG PO TABS
50.0000 mg | ORAL_TABLET | Freq: Every day | ORAL | Status: DC
  Administered 2019-07-31: 50 mg via ORAL
  Filled 2019-07-31: qty 1

## 2019-07-31 NOTE — Significant Event (Signed)
Not a rapid response event- Nurse consult  Overview: SOB with AMS  Initial Focused Assessment: Beside RN notified me that after pt ambulated to BR, she c/o SOB and was difficult to arouse. Upon my arrival, Kristen Goodman was lying in bed comfortably and in no distress. Initially, Kristen Goodman did not follow commands or respond to noxious stimuli. However, during my exam, she began to respond. She was able to identify objects and follow simple commands. She is generally weak in all 4 extremities however weak equally in bilateral extremities. CBG WNL  2236-98.6 F oral, HR 63 SR, 179/85 (111), RR 20 with sats 100% on RA  Interventions: -No interventions by myself.    Event Summary: Call received 2222 Arrived at call 2235 Call ended 2240  Madelynn Done

## 2019-07-31 NOTE — Progress Notes (Signed)
Patient complaining of some chest tightness and tingling in fingers. EKG completed and abnormal. DO Eliseo Squires has been notified. Awaiting orders.

## 2019-07-31 NOTE — Evaluation (Signed)
Physical Therapy Evaluation Patient Details Name: Kristen Goodman MRN: [id number] DOB: 07/25/1934 Today's Date: 07/31/2019   History of Present Illness  Pt is a 85 yo female presenting with AMS, after having progressive confusion and episodes of hallucinations at home. Pt PMH includes: HTN, depression/hallucinations.  Clinical Impression  Pt in bed upon arrival of PT, agreeable to evaluation at this time. Prior to admission the pt was independent with intermittent use of cane for ambulation, living with nephew, but home alone for the majority of each day. The pt now presents with limitations in functional mobility, power, and dynamic stability due to above dx, and will continue to benefit from skilled PT to address these deficits. The pt was able to perform multiple transfers in her room both with and without the use of an AD as well as a short ambulation in the room with and without an AD with good stability and no LOB. The pt would continue to benefit from skilled PT to improve power and dynamic stability with her movement as well as improved functional endurance.     Follow Up Recommendations Home health PT;Supervision for mobility/OOB    Equipment Recommendations  (pt has needed mobility equipment)    Recommendations for Other Services       Precautions / Restrictions Precautions Precautions: Fall Restrictions Weight Bearing Restrictions: No      Mobility  Bed Mobility Overal bed mobility: Modified Independent             General bed mobility comments: pt able to come to sitting EOB and reposition without assist  Transfers Overall transfer level: Needs assistance Equipment used: Rolling walker (2 wheeled);None Transfers: Sit to/from Stand Sit to Stand: Supervision         General transfer comment: pt able to stand from multiple surfaces with both use of RW and no AD. No LOB with either some VCs for hand positioning initially  Ambulation/Gait Ambulation/Gait  assistance: Supervision Gait Distance (Feet): 40 Feet Assistive device: Rolling walker (2 wheeled);None Gait Pattern/deviations: Step-through pattern;Decreased stride length;Shuffle;Trunk flexed   Gait velocity interpretation: <1.31 ft/sec, indicative of household ambulator General Gait Details: pt ambulates with short,  shuffling steps with minimal clearance and stride length, but reports this is how she "normally" ambulates at home. Pt with no LOB, slow but steady with turns and navigating in tight spaces  Stairs            Wheelchair Mobility    Modified Rankin (Stroke Patients Only)       Balance Overall balance assessment: Needs assistance   Sitting balance-Leahy Scale: Good Sitting balance - Comments: pt able to attempt donning socks/underwear while seated, struggles to fully reach feet     Standing balance-Leahy Scale: Good Standing balance comment: pt able to ambulate without AD, no LOB, slow but steady movements, suspect this is how she mobilizes at home                             Pertinent Vitals/Pain Pain Assessment: No/denies pain    Home Living Family/patient expects to be discharged to:: Private residence Living Arrangements: Other relatives(lives with nephew, daughter lives nearby) Available Help at Discharge: Family;Available PRN/intermittently(nephew works during the day) Type of Home: House Home Access: Level entry     Home Layout: One [place]: Environmental consultant - 2 wheels;Cane - single point Additional Comments: pt reports using cane more often than RW. states she can sit in tub/shower but  did not clarify built in tub bench vs shower chair    Prior Function Level of Independence: Independent with assistive device(s)         Comments: pt reports independent with ADLs with some use of cane. reports nephew runs errands for her     Hand Dominance   Dominant Hand: Right    Extremity/Trunk Assessment   Upper Extremity  Assessment Upper Extremity Assessment: Overall WFL for tasks assessed    Lower Extremity Assessment Lower Extremity Assessment: Overall WFL for tasks assessed(pt with some noted BLE weakness and endurance deficits, grossly functional for transfers and short ambulation)    Cervical / Trunk Assessment Cervical / Trunk Assessment: Kyphotic  Communication   Communication: No difficulties  Cognition Arousal/Alertness: Awake/alert Behavior During Therapy: WFL for tasks assessed/performed Overall Cognitive Status: No family/caregiver present to determine baseline cognitive functioning                                 General Comments: per chart, pt with sig AMS, confusion, and hallucinations at night, this morning the pt was calm, pleasant, cooperative, and was able to make appropriate jokes and identify areas/tasks with which she needs assistance.      General Comments General comments (skin integrity, edema, etc.): pt in restraints initially, RN present intermittantly through session, OKed removal of restraints    Exercises     Assessment/Plan    PT Assessment Patient needs continued PT services  PT Problem List Decreased strength;Decreased mobility;Decreased safety awareness;Decreased coordination;Decreased activity tolerance;Decreased balance       PT Treatment Interventions Therapeutic exercise;Gait training;Balance training;Stair training;Functional mobility training;Cognitive remediation;Therapeutic activities;Patient/family education    PT Goals (Current goals can be found in the Care Plan section)  Acute Rehab PT Goals Patient Stated Goal: to get dressed and return home PT Goal Formulation: With patient Time For Goal Achievement: 08/14/19 Potential to Achieve Goals: Good    Frequency Min 3X/week   Barriers to discharge Decreased caregiver support pt newphew would be at work for most of day, pt reports being home alone for majority of the day     Co-evaluation               AM-PAC PT "6 Clicks" Mobility  Outcome Measure Help needed turning from your back to your side while in a flat bed without using bedrails?: None Help needed moving from lying on your back to sitting on the side of a flat bed without using bedrails?: None Help needed moving to and from a bed to a chair (including a wheelchair)?: A Little Help needed standing up from a chair using your arms (e.g., wheelchair or bedside chair)?: None Help needed to walk in hospital room?: A Little Help needed climbing 3-5 steps with a railing? : A Little 6 Click Score: 21    End of Session Equipment Utilized During Treatment: Gait belt Activity Tolerance: Patient tolerated treatment well Patient left: in bed;with call bell/phone within reach;with bed alarm set;with nursing/sitter in room Nurse Communication: Mobility status PT Visit Diagnosis: Difficulty in walking, not elsewhere classified (R26.2);Muscle weakness (generalized) (M62.81)    Time: [id number] PT Time Calculation (min) (ACUTE ONLY): 28 min   Charges:   PT Evaluation $PT Eval Moderate Complexity: 1 Mod PT Treatments $Gait Training: 8-22 mins        Karma Ganja, PT, DPT   Acute Rehabilitation Department Pager #: [phone]  Otho Bellows 07/31/2019,  9:50 AM

## 2019-07-31 NOTE — Progress Notes (Signed)
Progress Note    Kristen Goodman  [id number] DOB: August 06, 1934  DOA: 07/30/2019 PCP: Tonia Ghent, MD    Brief Narrative:    Medical records reviewed and are as summarized below:  Kristen Goodman is an 85 y.o. female with medical history significant of depression, HTN, presented with AMS. Patient was very sleepy, most of history obtained via her nephew Legrand Como at bedside.  Patient has a remote history of visual hallucination/depression started about 10 years ago when her husband passed away.  And since then patient has been on Abilify by psychiatrist, and remained stable condition wise, her baseline is able to take care of self including dressing and taking bath, she walks independently.  Patient received her second COVID-19 vaccination on March 16.  Since then, patient has had disturbed sleep cycle, sleepy at daytime and kept awake at night.  Had had several episodes of visual hallucination in the evenings witnessed by daughter and nephew.    Assessment/Plan:   Active Problems:   Hallucinations   AMS (altered mental status)   Hepatitis A positive -patient states she does eat out on occasion- [employer] -health Dept notified -trend liver enzymes  AMS/Visual hallucination/behavioral issues -Patient very aware of how she has been acting: States last night she acted up because the nurses would not allow her to do what she wants to do.  She states she is old enough and should be able to do whatever she wants.  At home patient states she is not happy with the way "things have been going" so she has been acting up there as well. -Ammonia elevated: Hepatitis A positive -Patient had recent labs with PCP including a TSH that was within normal range -Doubt seizures so will DC EEG.  If patient continues to have episodes can order again -Suspect patient does have some dementia with sundowning as well -Outpatient follow-up with either Geri psych or neurology -Check  UDS  Sinus bradycardia -hold BB and monitor on tele  Hypertension Hold BB And lasix for now  HLD -hold statin  Mild hypernatremia -Encourage p.o. water intake -Check labs in the a.m.  obesity Body mass index is 35.35 kg/m.   Family Communication/Anticipated D/C date and plan/Code Status   DVT prophylaxis: Lovenox ordered. Code Status: Full Code.  Family Communication: Spoke with nephew Disposition Plan: Needs continued monitoring in the hospital for liver function and mental status changes   Medical Consultants:    None.     Subjective:   Patient states she has been acting up on purpose as she does not like the way things are going at home  Objective:    Vitals:   07/31/19 0041 07/31/19 0347 07/31/19 0737 07/31/19 0937  BP:  112/73 127/70   Pulse: 84 62 (!) 52   Resp:  17 16   Temp:  98.1 F (36.7 C) 98.3 F (36.8 C)   TempSrc:  Oral Axillary   SpO2:  97% 100% 100%  Weight:      Height:        Intake/Output Summary (Last 24 hours) at 07/31/2019 1044 Last data filed at 07/30/2019 1114 Gross per 24 hour  Intake 500 ml  Output --  Net 500 ml   Filed Weights   07/30/19 [id number]  Weight: 82.1 kg    Exam: In bed, just finished eating breakfast Pleasant and cooperative Regular rate and rhythm Positive bowel sounds, soft, nontender  Data Reviewed:   I have personally reviewed following labs and imaging  studies:  Labs: Labs show the following:   Basic Metabolic Panel: Recent Labs  Lab 07/28/19 1317 07/30/19 0840  NA 142 147*  K 3.9 3.9  CL 110 114*  CO2 27 23  GLUCOSE 95 88  BUN 17 14  CREATININE 1.14 1.27*  CALCIUM 9.6 9.1   GFR Estimated Creatinine Clearance: 32.4 mL/min (A) (by C-G formula based on SCr of 1.27 mg/dL (H)). Liver Function Tests: Recent Labs  Lab 07/28/19 1317 07/30/19 0840  AST 16 19  ALT 16 20  ALKPHOS 42 33*  BILITOT 1.5* 1.8*  PROT 7.3 6.6  ALBUMIN 4.0 3.5   No results for input(s): LIPASE, AMYLASE  in the last 168 hours. Recent Labs  Lab 07/30/19 0850 07/30/19 1710  AMMONIA 63* 49*   Coagulation profile No results for input(s): INR, PROTIME in the last 168 hours.  CBC: Recent Labs  Lab 07/28/19 1317 07/30/19 0840  WBC 3.9* 3.6*  NEUTROABS 2.4 2.0  HGB 13.4 14.0  HCT 40.5 44.5  MCV 84.5 87.6  PLT 164.0 127*   Cardiac Enzymes: No results for input(s): CKTOTAL, CKMB, CKMBINDEX, TROPONINI in the last 168 hours. BNP (last 3 results) No results for input(s): PROBNP in the last 8760 hours. CBG: Recent Labs  Lab 07/30/19 0817 07/30/19 2238  GLUCAP 76 132*   D-Dimer: No results for input(s): DDIMER in the last 72 hours. Hgb A1c: No results for input(s): HGBA1C in the last 72 hours. Lipid Profile: No results for input(s): CHOL, HDL, LDLCALC, TRIG, CHOLHDL, LDLDIRECT in the last 72 hours. Thyroid function studies: Recent Labs    07/30/19 1704  TSH 1.645   Anemia work up: No results for input(s): VITAMINB12, FOLATE, FERRITIN, TIBC, IRON, RETICCTPCT in the last 72 hours. Sepsis Labs: Recent Labs  Lab 07/28/19 1317 07/30/19 0840  WBC 3.9* 3.6*    Microbiology Recent Results (from the past 240 hour(s))  Urine Culture     Status: None   Collection Time: 07/28/19  3:49 PM   Specimen: Urine  Result Value Ref Range Status   MICRO NUMBER: [id number]  Final   SPECIMEN QUALITY: Adequate  Final   Sample Source URINE  Final   STATUS: FINAL  Final   Result: No Growth  Final  SARS CORONAVIRUS 2 (TAT 6-24 HRS) Nasopharyngeal Nasopharyngeal Swab     Status: None   Collection Time: 07/30/19  4:03 PM   Specimen: Nasopharyngeal Swab  Result Value Ref Range Status   SARS Coronavirus 2 NEGATIVE NEGATIVE Final    Comment: (NOTE) SARS-CoV-2 target nucleic acids are NOT DETECTED. The SARS-CoV-2 RNA is generally detectable in upper and lower respiratory specimens during the acute phase of infection. Negative results do not preclude SARS-CoV-2 infection, do not rule  out co-infections with other pathogens, and should not be used as the sole basis for treatment or other patient management decisions. Negative results must be combined with clinical observations, patient history, and epidemiological information. The expected result is Negative. Fact Sheet for Patients: SugarRoll.be Fact Sheet for Healthcare Providers: https://www.woods-mathews.com/ This test is not yet approved or cleared by the [place] FDA and  has been authorized for detection and/or diagnosis of SARS-CoV-2 by FDA under an Emergency Use Authorization (EUA). This EUA will remain  in effect (meaning this test can be used) for the duration of the COVID-19 declaration under Section 56 4(b)(1) of the Act, 21 U.S.C. section 360bbb-3(b)(1), unless the authorization is terminated or revoked sooner. Performed at [facility], [place]  [address] [place], [place] [address]     Procedures and diagnostic studies:  DG Chest 2 View  Result Date: 07/30/2019 CLINICAL DATA:  Dizziness and bilateral leg weakness for 2 days. EXAM: CHEST - 2 VIEW COMPARISON:  04/06/2014. FINDINGS: Trachea is midline. Heart is mildly enlarged. Linear atelectasis or scarring in the left lung base. Lungs are otherwise clear. No pleural fluid. Degenerative changes in the spine and shoulders. IMPRESSION: No acute findings. Electronically Signed   By: Lorin Picket M.D.   On: 07/30/2019 09:47   CT Head Wo Contrast  Result Date: 07/30/2019 CLINICAL DATA:  Altered mental status (AMS), unclear cause. EXAM: CT HEAD WITHOUT CONTRAST TECHNIQUE: Contiguous axial images were obtained from the base of the skull through the vertex without intravenous contrast. COMPARISON:  Head CT 11/25/2014 FINDINGS: Brain: There is no evidence of acute intracranial hemorrhage, intracranial mass, midline shift or extra-axial fluid collection.No demarcated cortical infarction. Mild chronic small vessel  ischemic changes are similar to prior exam 11/25/2014. Stable, mild generalized parenchymal atrophy. Vascular: No hyperdense vessel.  Atherosclerotic calcifications. Skull: Normal. Negative for fracture or focal lesion. Sinuses/Orbits: Visualized orbits demonstrate no acute abnormality. Trace ethmoid sinus mucosal thickening. No significant mastoid effusion. IMPRESSION: No evidence of acute intracranial abnormality. Stable, mild generalized parenchymal atrophy and chronic small vessel ischemic disease. Electronically Signed   By: Kellie Simmering DO   On: 07/30/2019 11:02   CT ABDOMEN PELVIS W CONTRAST  Result Date: 07/30/2019 CLINICAL DATA:  Jaundice. EXAM: CT ABDOMEN AND PELVIS WITH CONTRAST TECHNIQUE: Multidetector CT imaging of the abdomen and pelvis was performed using the standard protocol following bolus administration of intravenous contrast. CONTRAST:  48mL OMNIPAQUE IOHEXOL 300 MG/ML  SOLN COMPARISON:  None. FINDINGS: Lower chest: Mild atelectasis in the left base. There is a small hiatal hernia. No other significant abnormalities in the lung bases. Hepatobiliary: There is a mass in the left hepatic lobe with a discontinuous nodular peripheral enhancement consistent with a hemangioma. Multiple other low-attenuation lesions are seen in the liver, some too small to characterize. However, I believe these are most likely small cysts. The liver is otherwise unremarkable. Gallbladder is normal in appearance. No intra or extrahepatic biliary duct dilatation is identified. The portal vein is patent. Pancreas: The pancreas is normal in appearance with no pancreatic mass to explain the patient's jaundice. Spleen: Normal in size without focal abnormality. Adrenals/Urinary Tract: Adrenal glands are within normal limits. There is a cyst off the upper pole of the right kidney with an attenuation of 7 Hounsfield units. Another probable cyst is seen in the upper pole the right kidney on axial image 25 but too small to  characterize. There are a few lesions in the left kidney which may represent cysts, too small to characterize. However, there is also a mass off the left kidney on series 3, image 31 measuring 12 mm with an attenuation of 68 Hounsfield units, not definitely cystic. The kidneys are otherwise normal in appearance with no hydronephrosis or perinephric stranding. No ureterectasis or ureteral stone. The bladder is unremarkable. Stomach/Bowel: Other than a small hiatal hernia, the stomach is normal. The small bowel is within normal limits. Colonic diverticulosis is seen without diverticulitis. The visualized appendix is normal in appearance. Vascular/Lymphatic: The abdominal aorta is normal in caliber with no dissection or aneurysm. Minimal atherosclerotic changes are seen. No adenopathy identified. Reproductive: The patient is status post hysterectomy. No adnexal abnormalities are seen on the right. There is a large cystic mass with apparent septations in the  right side of the pelvis measuring 10.6 by 10.3 by 10.6 cm in craniocaudal, transverse, and AP dimensions. A separate right ovary is not visualized. Other: There is a fat containing periumbilical hernia. No free air or free fluid. Musculoskeletal: Degenerative changes are seen in the lower thoracic and lumbar spine. There is grade 1 anterolisthesis of L3 versus L4. No other abnormalities in the bones. IMPRESSION: 1. No cause for the patient's jaundice identified. Specifically, no intra or extrahepatic biliary duct dilatation is noted. No pancreatic mass identified. 2. There is a large complicated cystic mass in the right pelvis measuring up to 10.6 cm. The patient is status post hysterectomy. If her right ovary remains, this is probably ovarian in etiology, consistent with a neoplasm which could be benign or malignant. If the right ovary has been resected, this could be adnexal in origin. A large lymphocele is a possibility but considered less likely. Recommend  gynecologic consultation. This mass could be further assessed with pelvic MRI as an outpatient after gynecologic consultation if deemed clinically necessary. 3. There is a 12 mm mass exophytic off the left kidney which is nonspecific and could represent a small neoplasm/malignancy versus a hyperdense cyst. Recommend MRI as an outpatient for further assessment. 4. Left hepatic lobe small hemangioma. 5. Multiple small masses in the liver probably small cysts but too small to characterize. 6. Minimal atherosclerotic change in the abdominal aorta. 7. Colonic diverticulosis without diverticulitis. 8. Small fat containing periumbilical hernia. Electronically Signed   By: Dorise Bullion III M.D   On: 07/30/2019 13:28    Medications:   . amLODipine  5 mg Oral Daily  . ARIPiprazole  5 mg Oral Daily  . cholecalciferol  2,000 Units Oral Daily  . diclofenac Sodium  2 g Topical QID  . docusate sodium  100 mg Oral BID  . enoxaparin (LOVENOX) injection  40 mg Subcutaneous Q24H  . fluticasone  2 spray Each Nare Daily  . rosuvastatin  5 mg Oral q1800   Continuous Infusions:   LOS: 0 days   Geradine Girt  [facility]   How to contact the [facility] Attending or Consulting provider [place] or covering provider during after hours [place], for this patient?  1. Check the care team in [facility] and look for a) attending/consulting [facility] provider listed and b) the [facility] team listed 2. Log into [email or web address] and use [place]'s universal password to access. If you do not have the password, please contact the hospital operator. 3. Locate the [facility] provider you are looking for under [facility] and page to a number that you can be directly reached. 4. If you still have difficulty reaching the provider, please page the [facility] (Director on Call) for the Hospitalists listed on amion for assistance.  07/31/2019, 10:44 AM

## 2019-07-31 NOTE — Significant Event (Addendum)
Rapid Response Event Note  Overview: Time Called: 1838 Arrival Time: 1845 Event Type: Other (Comment)(Second set of eyes.)  Pt had complained of chest discomfort and tingling in her fingertips. RN completed an EKG and notified Dr. Eliseo Squires. Orders received for Troponin.   Initial Focused Assessment: Pt lying in bed, in no distress. Pt alert, oriented. Pt able to move all extremities and follows commands. Grips are equal. Pt denies pain when asked. Pt denies decreased sensation or tingling in her fingertips. Pt endorses equal sensation in all extremities. Lung sounds are clear. Pt is warm, dry to touch. Phlebotomy at bedside drawing pt's troponin level.  No intervention from RR RN.   Casimer Bilis

## 2019-08-01 DIAGNOSIS — I10 Essential (primary) hypertension: Secondary | ICD-10-CM

## 2019-08-01 DIAGNOSIS — G9341 Metabolic encephalopathy: Secondary | ICD-10-CM

## 2019-08-01 DIAGNOSIS — G301 Alzheimer's disease with late onset: Secondary | ICD-10-CM

## 2019-08-01 DIAGNOSIS — F0391 Unspecified dementia with behavioral disturbance: Secondary | ICD-10-CM

## 2019-08-01 DIAGNOSIS — E870 Hyperosmolality and hypernatremia: Secondary | ICD-10-CM

## 2019-08-01 DIAGNOSIS — F0281 Dementia in other diseases classified elsewhere with behavioral disturbance: Secondary | ICD-10-CM

## 2019-08-01 DIAGNOSIS — E669 Obesity, unspecified: Secondary | ICD-10-CM

## 2019-08-01 DIAGNOSIS — R443 Hallucinations, unspecified: Secondary | ICD-10-CM

## 2019-08-01 DIAGNOSIS — R001 Bradycardia, unspecified: Secondary | ICD-10-CM

## 2019-08-01 DIAGNOSIS — E785 Hyperlipidemia, unspecified: Secondary | ICD-10-CM

## 2019-08-01 DIAGNOSIS — E7220 Disorder of urea cycle metabolism, unspecified: Secondary | ICD-10-CM

## 2019-08-01 DIAGNOSIS — F03918 Unspecified dementia, unspecified severity, with other behavioral disturbance: Secondary | ICD-10-CM

## 2019-08-01 LAB — BASIC METABOLIC PANEL
Anion gap: 10 (ref 5–15)
BUN: 14 mg/dL (ref 8–23)
CO2: 24 mmol/L (ref 22–32)
Calcium: 9.2 mg/dL (ref 8.9–10.3)
Chloride: 111 mmol/L (ref 98–111)
Creatinine, Ser: 1.15 mg/dL — ABNORMAL HIGH (ref 0.44–1.00)
GFR calc Af Amer: 51 mL/min — ABNORMAL LOW (ref >60)
GFR calc non Af Amer: 44 mL/min — ABNORMAL LOW (ref >60)
Glucose, Bld: 139 mg/dL — ABNORMAL HIGH (ref 70–99)
Potassium: 3.6 mmol/L (ref 3.5–5.1)
Sodium: 145 mmol/L (ref 135–145)

## 2019-08-01 MED ORDER — OLANZAPINE 5 MG PO TBDP
5.0000 mg | ORAL_TABLET | Freq: Every day | ORAL | Status: DC | PRN
  Filled 2019-08-01: qty 1

## 2019-08-01 MED ORDER — DICLOFENAC SODIUM 1 % EX GEL: 2.0000 g | Freq: Four times a day (QID) | CUTANEOUS | 1 refills | Status: AC

## 2019-08-01 MED ORDER — FUROSEMIDE 40 MG PO TABS: 40.0000 mg | ORAL_TABLET | ORAL | Status: AC | PRN

## 2019-08-01 MED ORDER — HYDRALAZINE HCL 25 MG PO TABS: 25.0000 mg | ORAL_TABLET | Freq: Two times a day (BID) | ORAL | 1 refills | Status: AC

## 2019-08-01 MED ORDER — TRAZODONE HCL 50 MG PO TABS: 50.0000 mg | ORAL_TABLET | Freq: Every day | ORAL | 0 refills | Status: AC

## 2019-08-01 MED ORDER — OLANZAPINE 5 MG PO TBDP: 5.0000 mg | ORAL_TABLET | Freq: Every day | ORAL | 0 refills | Status: AC | PRN

## 2019-08-01 NOTE — Discharge Summary (Signed)
Physician Discharge Summary  Kristen Goodman [id number] DOB: [birth year] DOA: Aug 16, 2019  PCP: Tonia Ghent, MD  Admit date: 16-Aug-2019 Discharge date: 08/01/2019 Consultations: none Admitted From: home Disposition: home with [facility]  Discharge Diagnoses:  Principal Problem:   Acute metabolic encephalopathy Active Problems:   Dementia with behavioral disturbance (HCC)   Hyperlipemia   HTN (hypertension)   Hallucinations   Hepatitis A   Hypernatremia   Hyperbilirubinemia   Hyperammonemia (HCC)   Sinus bradycardia   Obesity (BMI 30-39.9)   Hospital Course Summary: 85 y.o.femalewith medical history significant ofdepression/?psychosis, HTN, presented withAMS.Patient has a remote history of visual hallucination/depression startedabout 10 years ago when her husband passed away. Reportedly,  patient prescribed Abilify by psychiatrist and been on it since then,and remained stable. At baseline, able to take care for self including dressing , taking bath and walks independently. Patient received her second COVID-19 vaccination on March 16. Since then, patient has had disturbed sleep cycle, sleepy at daytime and awake at night.Had had several episodes of visual hallucination in the evenings witnessed by daughter and nephew.On 3/22,nephew tookher topcp increased her aripiprazole from 0.5 mg to 1 mg. Was non verbal on the day of admission, brought to ED ED Course: Afebrile, somnolent, sinus bradycardia HR in 50s. Sodium 147, BUN 14, creat 1.27 (baseline 1.1-1.2), T bili 1.8. CT head negative, blood work showed elevated ammonia level at 63, UA showed no signs of UTI. CT abd/pelvis-no hydronephrosis or cholelithiasis but revealed multiple incidental findings as outlined below.  Hospital course: Patient admitted to [facility] for further evaluation and management. Further w/u revelaed Hepatitis A positive. Repeat ammonia level improved to 40s. Initially EEG ordered but later noted that  patient was very aware of how she has been acting:  At home patient states she is not happy with the way "things have been going" so she has been acting up there as well. Dementia with sundowning suspected. Outpatient follow-up with either [facility] psych or neurology recommended. BB held for sinus bradycardia. Lasix held for rising creat/hypernatremia. Encouraged oral fluid intake.    Acute metabolic encephalopathy: Present on admission.  According to daughter, patient was having periods of agitation with hallucinations at home.  Differentials include encephalopathy in the setting of underlying metabolic conditions (dehydration, elevated ammonia level/hepatitis, sinus bradycardia etc.) vs dementia with sundowning vs psychiatric illness.  According to daughter she has had agitation mostly in the evenings at home.  CT head showed mild generalized parenchymal atrophy and chronic small vessel ischemic disease.  UA, urine drug screen unremarkable. Patient appears to have improved with IV hydration.  She has not had any hallucinations while here but per prior MD notes, patient apparently had "acting out" episodes. Reportedly acknowledged that she acted up because the nurses would not allow her to do what she wants to do. She states she is old enough and should be able to do whatever she wants.  Today during my interview she is oriented x3.  When asked about stress at home, she denied any stress and stated her nephew lives with her and is the primary caregiver, who continues to help her.  He was apparently here earlier to pick her up and take her home but left for the day.  When asked about underlying psychiatric history, patient stated she had seen a psychiatrist many years back (in 08/15/08 when her husband died) for situational depression and was prescribed Abilify.  According to daughter, this medication has helped her so far and attempts to transition to  other medications apparently did not work in her favor in the  past.  It appears that patient has been started on trazodone at night for sleep and has done well.  She slept well and had a good day today.  Anxious to go home.  I did call and discussed in detail with daughter regarding above assessment and plan.  Daughter agreeable with continuing Abilify and trazodone at night.  She however would like a as needed medication in case of worsening hallucinations/agitation at home until she can follow-up with a psychiatrist.  Accordingly Zyprexa as needed available.  Sinus bradycardia has improved since beta-blockers held.  Dementia/depression: Continue Aricept, Abilify and trazodone as discussed above.  Zyprexa available for as needed use.  CT head findings of atrophy consistent with dementia.Outpatient follow-up with either [facility] psych or neurology recommended.  Reportedly, recent TSH through PCP office within normal limits.   Hyperbilirubinemia: Hepatitis profile tested positive for hepatitis A IgM.  Patient does not have fever or diarrhea.  Her ammonia level was elevated on admission at 63 but now improved to 40s and mental status is also improved.  She should follow-up with PCP regarding other hepatitis vaccinations.  Of note, CT abdomen obtained in the ED revealed multiple intra-abdominal cysts as well as hepatic angiomas that could be causing hyperbilirubinemia. CT  as described below:\  "1.large complicated cystic mass in the right pelvis measuring up to 10.6 cm. The patient is status post hysterectomy. If her right ovary remains, this is probably ovarian in etiology,consistent with a neoplasm which could be benign or malignant. If the right ovary has been resected, this could be adnexal in origin. 2. A large lymphocele is a possibility but considered less likely.Recommend gynecologic consultation. This mass could be further assessed with pelvic MRI as an outpatient after gynecologic consultation if deemed clinically necessary. 3. There is a 12 mm mass exophytic off  the left kidney which is nonspecific and could represent a small neoplasm/malignancy versus a hyperdense cyst. Recommend MRI as an outpatient for further assessment. 4. Left hepatic lobe small hemangioma. 5. Multiple small masses in the liver probably small cysts but too small to characterize."   I have informed daughter of these findings and advised to f/u PCP/Gyn as outpatient.   Sinus bradycardia: Will hold off on beta-blockers for now.  Heart rate has improved from 50s to 60s now.  Recent TSH wnl. Follow up PCP.  Hypertension: Hold BB And lasix for now.  Noted that patient was started on Norvasc during this hospitalization and blood pressure appears to be within normal limits.  Will change Norvasc to hydralazine given propensity to exacerbate chronic leg edema.    Hyperlipidemia: Resume home medications.  Transaminases within normal limits.  ObesityBody mass index is 35.35 kg/m. F/u PCP  Discharge Exam:  Vitals:   08/01/19 1222 08/01/19 1531  BP: (!) 141/88 116/71  Pulse: 89 63  Resp: 16 16  Temp: 99 F (37.2 C) 98.3 F (36.8 C)  SpO2: 97% 100%   Vitals:   08/01/19 0730 08/01/19 0949 08/01/19 1222 08/01/19 1531  BP: 126/74 (!) 142/76 (!) 141/88 116/71  Pulse: 66  89 63  Resp: 16  16 16   Temp: 98.2 F (36.8 C)  99 F (37.2 C) 98.3 F (36.8 C)  TempSrc: Oral  Oral Oral  SpO2: 99%  97% 100%  Weight:      Height:        General: Pt is alert, awake, not in acute distress Cardiovascular: RRR,  S1/S2 +, no rubs, no gallops Respiratory: CTA bilaterally, no wheezing, no rhonchi Abdominal: Soft, NT, ND, bowel sounds + Extremities: no edema, no cyanosis  Discharge Condition:Stable CODE STATUS: Diet recommendation: Recommendations for Outpatient Follow-up:  1. Follow up with PCP:  2. Follow up with consultants:  3. Please obtain follow up labs including:   Home Health services upon discharge:  Equipment/Devices upon discharge:   Discharge  Instructions:  Discharge Instructions    Call MD for:  difficulty breathing, headache or visual disturbances   Complete by: As directed    Call MD for:  extreme fatigue   Complete by: As directed    Call MD for:  persistant dizziness or light-headedness   Complete by: As directed    Call MD for:  persistant nausea and vomiting   Complete by: As directed    Call MD for:  severe uncontrolled pain   Complete by: As directed    Diet - low sodium heart healthy   Complete by: As directed    Discharge instructions   Complete by: As directed    Psychiatry follow up recommended in 7-10 days   Face-to-face encounter (required for Medicare/Medicaid patients)   Complete by: As directed    I Guilford Shi certify that this patient is under my care and that I, or a nurse practitioner or physician's assistant working with me, had a face-to-face encounter that meets the physician face-to-face encounter requirements with this patient on 08/01/2019. The encounter with the patient was in whole, or in part for the following medical condition(s) which is the primary reason for home health care (List medical condition): Dementia with deteriorating function/cognitive status   The encounter with the patient was in whole, or in part, for the following medical condition, which is the primary reason for home health care: dementia with AMS   I certify that, based on my findings, the following services are medically necessary home health services: Physical therapy   Reason for Medically Necessary Home Health Services:  Skilled Nursing- Change/Decline in Patient Status Therapy- Personnel officer, Training and development officer and Stair Training Therapy- Therapeutic Exercises to Increase Strength and Endurance     My clinical findings support the need for the above services: Unsafe ambulation due to balance issues   Further, I certify that my clinical findings support that this patient is homebound due to: Unable to leave home  safely without assistance   Home Health   Complete by: As directed    To provide the following care/treatments:  PT OT Home Health Aide     Increase activity slowly   Complete by: As directed      Allergies as of 08/01/2019      Reactions   Risperdal [risperidone] Other (See Comments)   Worsening hallucinations.       Medication List    STOP taking these medications   metoprolol tartrate 100 MG tablet Commonly known as: LOPRESSOR     TAKE these medications   acetaminophen 500 MG tablet Commonly known as: TYLENOL Take 500 mg by mouth as needed for mild pain or moderate pain.   ARIPiprazole 5 MG tablet Commonly known as: ABILIFY Take 0.5 tablets (2.5 mg total) by mouth daily. What changed: how much to take   Cholecalciferol 50 MCG (2000 UT) Tabs Take 1 tablet (2,000 Units total) by mouth daily.   diclofenac Sodium 1 % Gel Commonly known as: VOLTAREN Apply 2 g topically 4 (four) times daily.   fluticasone 50 MCG/ACT nasal spray Commonly  known as: FLONASE Place 2 sprays into both nostrils daily. What changed:   when to take this  reasons to take this   furosemide 40 MG tablet Commonly known as: LASIX Take 1 tablet (40 mg total) by mouth as needed for fluid or edema. What changed:   when to take this  reasons to take this   hydrALAZINE 25 MG tablet Commonly known as: APRESOLINE Take 1 tablet (25 mg total) by mouth 2 (two) times daily.   OLANZapine zydis 5 MG disintegrating tablet Commonly known as: ZYPREXA Take 1 tablet (5 mg total) by mouth daily as needed for up to 10 days (agitation).   rosuvastatin 5 MG tablet Commonly known as: CRESTOR TAKE 1 TABLET BY MOUTH EVERY DAY What changed:   how much to take  how to take this  when to take this  additional instructions   traZODone 50 MG tablet Commonly known as: DESYREL Take 1 tablet (50 mg total) by mouth at bedtime.      Follow-up Information    [facility] Follow up.   Why:  The home health agency will contact you for the first home visit. Contact information: [phone]         Allergies  Allergen Reactions  . Risperdal [Risperidone] Other (See Comments)    Worsening hallucinations.       The results of significant diagnostics from this hospitalization (including imaging, microbiology, ancillary and laboratory) are listed below for reference.    Labs: BNP (last 3 results) No results for input(s): BNP in the last 8760 hours. Basic Metabolic Panel: Recent Labs  Lab 07/28/19 1317 07/30/19 0840 08/01/19 1504  NA 142 147* 145  K 3.9 3.9 3.6  CL 110 114* 111  CO2 27 23 24   GLUCOSE 95 88 139*  BUN 17 14 14   CREATININE 1.14 1.27* 1.15*  CALCIUM 9.6 9.1 9.2   Liver Function Tests: Recent Labs  Lab 07/28/19 1317 07/30/19 0840  AST 16 19  ALT 16 20  ALKPHOS 42 33*  BILITOT 1.5* 1.8*  PROT 7.3 6.6  ALBUMIN 4.0 3.5   No results for input(s): LIPASE, AMYLASE in the last 168 hours. Recent Labs  Lab 07/30/19 0850 07/30/19 1710  AMMONIA 63* 49*   CBC: Recent Labs  Lab 07/28/19 1317 07/30/19 0840  WBC 3.9* 3.6*  NEUTROABS 2.4 2.0  HGB 13.4 14.0  HCT 40.5 44.5  MCV 84.5 87.6  PLT 164.0 127*   Cardiac Enzymes: No results for input(s): CKTOTAL, CKMB, CKMBINDEX, TROPONINI in the last 168 hours. BNP: Invalid input(s): POCBNP CBG: Recent Labs  Lab 07/30/19 0817 07/30/19 2238  GLUCAP 76 132*   D-Dimer No results for input(s): DDIMER in the last 72 hours. Hgb A1c No results for input(s): HGBA1C in the last 72 hours. Lipid Profile No results for input(s): CHOL, HDL, LDLCALC, TRIG, CHOLHDL, LDLDIRECT in the last 72 hours. Thyroid function studies Recent Labs    07/30/19 1704  TSH 1.645   Anemia work up No results for input(s): VITAMINB12, FOLATE, FERRITIN, TIBC, IRON, RETICCTPCT in the last 72 hours. Urinalysis    Component Value Date/Time   COLORURINE STRAW (A) 07/30/2019 0842   APPEARANCEUR CLEAR 07/30/2019 0842    LABSPEC 1.009 07/30/2019 [place] 7.0 07/30/2019 0842   GLUCOSEU NEGATIVE 07/30/2019 0842   HGBUR NEGATIVE 07/30/2019 [place] NEGATIVE 07/30/2019 0842   BILIRUBINUR neg 07/28/2019 1222   KETONESUR NEGATIVE 07/30/2019 0842   PROTEINUR NEGATIVE 07/30/2019 [id number]  UROBILINOGEN 0.2 07/28/2019 1222   NITRITE NEGATIVE 07/30/2019 0842   LEUKOCYTESUR NEGATIVE 07/30/2019 [id number]   Sepsis Labs Invalid input(s): PROCALCITONIN,  WBC,  LACTICIDVEN Microbiology Recent Results (from the past 240 hour(s))  Urine Culture     Status: None   Collection Time: 07/28/19  3:49 PM   Specimen: Urine  Result Value Ref Range Status   MICRO NUMBER: [id number]  Final   SPECIMEN QUALITY: Adequate  Final   Sample Source URINE  Final   STATUS: FINAL  Final   Result: No Growth  Final  SARS CORONAVIRUS 2 (TAT 6-24 HRS) Nasopharyngeal Nasopharyngeal Swab     Status: None   Collection Time: 07/30/19  4:03 PM   Specimen: Nasopharyngeal Swab  Result Value Ref Range Status   SARS Coronavirus 2 NEGATIVE NEGATIVE Final    Comment: (NOTE) SARS-CoV-2 target nucleic acids are NOT DETECTED. The SARS-CoV-2 RNA is generally detectable in upper and lower respiratory specimens during the acute phase of infection. Negative results do not preclude SARS-CoV-2 infection, do not rule out co-infections with other pathogens, and should not be used as the sole basis for treatment or other patient management decisions. Negative results must be combined with clinical observations, patient history, and epidemiological information. The expected result is Negative. Fact Sheet for Patients: SugarRoll.be Fact Sheet for Healthcare Providers: https://www.woods-mathews.com/ This test is not yet approved or cleared by the [place] FDA and  has been authorized for detection and/or diagnosis of SARS-CoV-2 by FDA under an Emergency Use Authorization (EUA). This EUA will remain  in  effect (meaning this test can be used) for the duration of the COVID-19 declaration under Section 56 4(b)(1) of the Act, 21 U.S.C. section 360bbb-3(b)(1), unless the authorization is terminated or revoked sooner. Performed at [facility], [place] [address], [place], [place] [address]     Procedures/Studies: DG Chest 2 View  Result Date: 07/30/2019 CLINICAL DATA:  Dizziness and bilateral leg weakness for 2 days. EXAM: CHEST - 2 VIEW COMPARISON:  04/06/2014. FINDINGS: Trachea is midline. Heart is mildly enlarged. Linear atelectasis or scarring in the left lung base. Lungs are otherwise clear. No pleural fluid. Degenerative changes in the spine and shoulders. IMPRESSION: No acute findings. Electronically Signed   By: Lorin Picket M.D.   On: 07/30/2019 09:47   CT Head Wo Contrast  Result Date: 07/30/2019 CLINICAL DATA:  Altered mental status (AMS), unclear cause. EXAM: CT HEAD WITHOUT CONTRAST TECHNIQUE: Contiguous axial images were obtained from the base of the skull through the vertex without intravenous contrast. COMPARISON:  Head CT 11/25/2014 FINDINGS: Brain: There is no evidence of acute intracranial hemorrhage, intracranial mass, midline shift or extra-axial fluid collection.No demarcated cortical infarction. Mild chronic small vessel ischemic changes are similar to prior exam 11/25/2014. Stable, mild generalized parenchymal atrophy. Vascular: No hyperdense vessel.  Atherosclerotic calcifications. Skull: Normal. Negative for fracture or focal lesion. Sinuses/Orbits: Visualized orbits demonstrate no acute abnormality. Trace ethmoid sinus mucosal thickening. No significant mastoid effusion. IMPRESSION: No evidence of acute intracranial abnormality. Stable, mild generalized parenchymal atrophy and chronic small vessel ischemic disease. Electronically Signed   By: Kellie Simmering DO   On: 07/30/2019 11:02   CT ABDOMEN PELVIS W CONTRAST  Result Date: 07/30/2019 CLINICAL DATA:  Jaundice. EXAM:  CT ABDOMEN AND PELVIS WITH CONTRAST TECHNIQUE: Multidetector CT imaging of the abdomen and pelvis was performed using the standard protocol following bolus administration of intravenous contrast. CONTRAST:  25mL OMNIPAQUE IOHEXOL 300 MG/ML  SOLN COMPARISON:  None. FINDINGS:  Lower chest: Mild atelectasis in the left base. There is a small hiatal hernia. No other significant abnormalities in the lung bases. Hepatobiliary: There is a mass in the left hepatic lobe with a discontinuous nodular peripheral enhancement consistent with a hemangioma. Multiple other low-attenuation lesions are seen in the liver, some too small to characterize. However, I believe these are most likely small cysts. The liver is otherwise unremarkable. Gallbladder is normal in appearance. No intra or extrahepatic biliary duct dilatation is identified. The portal vein is patent. Pancreas: The pancreas is normal in appearance with no pancreatic mass to explain the patient's jaundice. Spleen: Normal in size without focal abnormality. Adrenals/Urinary Tract: Adrenal glands are within normal limits. There is a cyst off the upper pole of the right kidney with an attenuation of 7 Hounsfield units. Another probable cyst is seen in the upper pole the right kidney on axial image 25 but too small to characterize. There are a few lesions in the left kidney which may represent cysts, too small to characterize. However, there is also a mass off the left kidney on series 3, image 31 measuring 12 mm with an attenuation of 68 Hounsfield units, not definitely cystic. The kidneys are otherwise normal in appearance with no hydronephrosis or perinephric stranding. No ureterectasis or ureteral stone. The bladder is unremarkable. Stomach/Bowel: Other than a small hiatal hernia, the stomach is normal. The small bowel is within normal limits. Colonic diverticulosis is seen without diverticulitis. The visualized appendix is normal in appearance. Vascular/Lymphatic: The  abdominal aorta is normal in caliber with no dissection or aneurysm. Minimal atherosclerotic changes are seen. No adenopathy identified. Reproductive: The patient is status post hysterectomy. No adnexal abnormalities are seen on the right. There is a large cystic mass with apparent septations in the right side of the pelvis measuring 10.6 by 10.3 by 10.6 cm in craniocaudal, transverse, and AP dimensions. A separate right ovary is not visualized. Other: There is a fat containing periumbilical hernia. No free air or free fluid. Musculoskeletal: Degenerative changes are seen in the lower thoracic and lumbar spine. There is grade 1 anterolisthesis of L3 versus L4. No other abnormalities in the bones. IMPRESSION: 1. No cause for the patient's jaundice identified. Specifically, no intra or extrahepatic biliary duct dilatation is noted. No pancreatic mass identified. 2. There is a large complicated cystic mass in the right pelvis measuring up to 10.6 cm. The patient is status post hysterectomy. If her right ovary remains, this is probably ovarian in etiology, consistent with a neoplasm which could be benign or malignant. If the right ovary has been resected, this could be adnexal in origin. A large lymphocele is a possibility but considered less likely. Recommend gynecologic consultation. This mass could be further assessed with pelvic MRI as an outpatient after gynecologic consultation if deemed clinically necessary. 3. There is a 12 mm mass exophytic off the left kidney which is nonspecific and could represent a small neoplasm/malignancy versus a hyperdense cyst. Recommend MRI as an outpatient for further assessment. 4. Left hepatic lobe small hemangioma. 5. Multiple small masses in the liver probably small cysts but too small to characterize. 6. Minimal atherosclerotic change in the abdominal aorta. 7. Colonic diverticulosis without diverticulitis. 8. Small fat containing periumbilical hernia. Electronically Signed    By: Dorise Bullion III M.D   On: 07/30/2019 13:28    Time coordinating discharge: Over 30 minutes  SIGNED:   Guilford Shi, MD  [facility] 08/01/2019, 5:31 PM

## 2019-08-01 NOTE — Progress Notes (Signed)
Physical Therapy Treatment Patient Details Name: Kristen Goodman MRN: [id number] DOB: November 01, 1934 Today's Date: 08/01/2019    History of Present Illness Pt is a 85 yo female presenting with AMS, after having progressive confusion and episodes of hallucinations at home. Pt PMH includes: HTN, depression/hallucinations.    PT Comments    Pt in bed upon arrival of PT, agreeable to PT session with focus on dynamic stability and independence with mobility. The pt continues to present with limitations in functional mobility and dynamic stability compared to their prior level of function and independence due to above dx which are further complicated by cog deficits that impair safety planning and awareness. The pt was able to complete short bouts of walking in the room as well as multiple balance exercises focused on SLS and reduced BOS. The pt did benefit from increased support/assist to come to standing initially, but was able to progress to minG for safety within session. The pt will continue to benefit from skilled PT to progress independence and safety with mobility.     Follow Up Recommendations  Home health PT;Supervision/Assistance - 24 hour(pt will need 24/7 supervision initially, nephew works during the day, will need to arrange supervision for day time)     Equipment Recommendations  None recommended by PT(pt has needed equipment)    Recommendations for Other Services       Precautions / Restrictions Precautions Precautions: Fall Restrictions Weight Bearing Restrictions: No    Mobility  Bed Mobility Overal bed mobility: Modified Independent             General bed mobility comments: pt able to come to sitting EOB and reposition without assist  Transfers Overall transfer level: Needs assistance Equipment used: Rolling walker (2 wheeled);None Transfers: Sit to/from Stand Sit to Stand: Min guard         General transfer comment: pt able to stand from multiple  surfaces with both use of RW and no AD. No LOB with either, some VCs for hand positioning initially. increased need for assist to power up today on first attempt  Ambulation/Gait Ambulation/Gait assistance: Min guard Gait Distance (Feet): 20 Feet Assistive device: Rolling walker (2 wheeled);None Gait Pattern/deviations: Step-through pattern;Decreased stride length;Shuffle;Trunk flexed   Gait velocity interpretation: <1.31 ft/sec, indicative of household ambulator General Gait Details: pt ambulates with short,  shuffling steps with minimal clearance and stride length, but reports this is how she "normally" ambulates at home. Pt with no LOB, slow but steady with turns and navigating in tight spaces   Stairs             Wheelchair Mobility    Modified Rankin (Stroke Patients Only)       Balance Overall balance assessment: Needs assistance   Sitting balance-Leahy Scale: Good Sitting balance - Comments: pt able to attempt donning socks/underwear while seated, struggles to fully reach feet     Standing balance-Leahy Scale: Good Standing balance comment: pt able to ambulate without AD, no LOB, slow but steady movements, suspect this is how she mobilizes at home                            Cognition Arousal/Alertness: Awake/alert Behavior During Therapy: [facility] for tasks assessed/performed;Flat affect Overall Cognitive Status: No family/caregiver present to determine baseline cognitive functioning  General Comments: per chart, pt with sig AMS, confusion, and hallucinations at night, this morning the pt was calm, pleasant, cooperative, and was able to make appropriate jokes and identify areas/tasks with which she needs assistance.      Exercises Other Exercises Other Exercises: SLS with toe taps - x5 sets with each leg, reaching for different colored tiles on floor. Pt with need for min/modA HHA for stability. Other  Exercises: 5x STS    General Comments        Pertinent Vitals/Pain Pain Assessment: Faces Pain Score: 3  Pain Location: bilateral knees Pain Descriptors / Indicators: Aching;Grimacing;Sore Pain Intervention(s): Limited activity within patient's tolerance;Monitored during session;Repositioned    Home Living                      Prior Function            PT Goals (current goals can now be found in the care plan section) Acute Rehab PT Goals Patient Stated Goal: to get dressed and return home PT Goal Formulation: With patient Time For Goal Achievement: 08/14/19 Potential to Achieve Goals: Good Progress towards PT goals: Progressing toward goals    Frequency    Min 3X/week      PT Plan Current plan remains appropriate    Co-evaluation              AM-PAC PT "6 Clicks" Mobility   Outcome Measure  Help needed turning from your back to your side while in a flat bed without using bedrails?: None Help needed moving from lying on your back to sitting on the side of a flat bed without using bedrails?: None Help needed moving to and from a bed to a chair (including a wheelchair)?: A Little Help needed standing up from a chair using your arms (e.g., wheelchair or bedside chair)?: A Little Help needed to walk in hospital room?: A Little Help needed climbing 3-5 steps with a railing? : A Little 6 Click Score: 20    End of Session Equipment Utilized During Treatment: Gait belt Activity Tolerance: Patient tolerated treatment well Patient left: in chair;with call bell/phone within reach;with family/visitor present;with chair alarm set Nurse Communication: Mobility status PT Visit Diagnosis: Difficulty in walking, not elsewhere classified (R26.2);Muscle weakness (generalized) (M62.81)     Time: [id number] PT Time Calculation (min) (ACUTE ONLY): 21 min  Charges:  $Gait Training: 8-22 mins                     Karma Ganja, PT, DPT   Acute Rehabilitation  Department Pager #: [phone]   Otho Bellows 08/01/2019, 4:02 PM

## 2019-08-01 NOTE — TOC Initial Note (Signed)
Transition of Care [facility]) - Initial/Assessment Note    Patient Details  Name: Kristen Goodman MRN: [id number] Date of Birth: 1934/06/01  Transition of Care [facility]) CM/SW Contact:    Pollie Friar, RN Phone Number: 08/01/2019, 11:06 AM  Clinical Narrative:                 CM spoke to patients daughter over the phone. She states pt was set up with [facility] prior to admission through her PCP office. Daughter wants to continue with [facility]. Butch Penny with [facility] made aware.  Pt has cane and walker at home.  Daughter states family is going to come together to provide needed supervision. Nephew to provide transport home when medically ready. MD please place [place] orders prior to d/c.   Expected Discharge Plan: [place] Barriers to Discharge: Continued Medical Work up   Patient Goals and CMS Choice   CMS Medicare.gov Compare Post Acute Care list provided to:: Patient Represenative (must comment) Choice offered to / list presented to : Adult Children(daughter)  Expected Discharge Plan and Services Expected Discharge Plan: [place]   Discharge Planning Services: CM Consult Post Acute Care Choice: [place] arrangements for the past 2 months: [place]: [place] ([place]) Date [place]: 08/01/19   Representative spoke with at [place]: Butch Penny  Prior Living Arrangements/Services Living arrangements for the past 2 months: Single Family Home Lives with:: Other (Comment)(nephew) Patient language and need for interpreter reviewed:: Yes Do you feel safe going back to the place where you live?: Yes      Need for Family Participation in Patient Care: Yes (Comment) Care giver support system in place?: No (comment)(nephew works during the day. family coming together to provide needed supervision) Current home services: Home PT([facility]) Criminal Activity/Legal Involvement Pertinent to  Current Situation/Hospitalization: No - Comment as needed  Activities of Daily Living      Permission Sought/Granted                  Emotional Assessment Appearance:: Appears stated age         Psych Involvement: No (comment)  Admission diagnosis:  Hypernatremia [E87.0] Abnormal LFTs [R94.5] Increased ammonia level [R79.89] Altered mental status, unspecified altered mental status type [R41.82] AMS (altered mental status) [R41.82] Patient Active Problem List   Diagnosis Date Noted  . Hepatitis A 07/31/2019  . AMS (altered mental status) 07/30/2019  . Increased ammonia level   . Oral pain 05/21/2019  . Neck pain 05/21/2019  . Muscle pain 03/21/2017  . Healthcare maintenance 03/15/2016  . Advance care planning 01/24/2015  . Syncope 11/25/2014  . LBP (low back pain) 05/30/2013  . Obesity, unspecified 12/24/2012  . Medicare annual wellness visit, subsequent 06/02/2012  . Hallucinations 01/03/2012  . Memory changes 12/25/2011  . Shoulder pain 11/27/2011  . Renal insufficiency 11/23/2010  . History of gout 02/28/2010  . Vitamin D deficiency 03/10/2009  . 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 Hyperglycemia 06/08/2001  . Elevated lipids 03/09/1995  . Essential hypertension 05/08/1978   PCP:  Tonia Ghent, MD Pharmacy:   [id number] [address], [place] [address] Phone: [phone] Fax: [phone]     Social Determinants of Health (SDOH) Interventions  Readmission Risk Interventions No flowsheet data found.

## 2019-08-01 NOTE — Plan of Care (Signed)

## 2019-08-03 ENCOUNTER — Emergency Department (HOSPITAL_COMMUNITY)
Admission: EM | Admit: 2019-08-03 | Discharge: 2019-08-05 | Disposition: A | Discharge status: Home / Self Care | Payer: Medicare Other | Attending: Emergency Medicine | Admitting: Emergency Medicine

## 2019-08-03 ENCOUNTER — Encounter (HOSPITAL_COMMUNITY): Payer: Self-pay | Admitting: Emergency Medicine

## 2019-08-03 ENCOUNTER — Other Ambulatory Visit: Payer: Self-pay

## 2019-08-03 DIAGNOSIS — F0390 Unspecified dementia without behavioral disturbance: Secondary | ICD-10-CM | POA: Insufficient documentation

## 2019-08-03 DIAGNOSIS — Z046 Encounter for general psychiatric examination, requested by authority: Secondary | ICD-10-CM | POA: Diagnosis not present

## 2019-08-03 DIAGNOSIS — Z79899 Other long term (current) drug therapy: Secondary | ICD-10-CM | POA: Insufficient documentation

## 2019-08-03 DIAGNOSIS — F29 Unspecified psychosis not due to a substance or known physiological condition: Secondary | ICD-10-CM | POA: Diagnosis not present

## 2019-08-03 DIAGNOSIS — Z20822 Contact with and (suspected) exposure to covid-19: Secondary | ICD-10-CM | POA: Insufficient documentation

## 2019-08-03 DIAGNOSIS — F329 Major depressive disorder, single episode, unspecified: Secondary | ICD-10-CM | POA: Insufficient documentation

## 2019-08-03 DIAGNOSIS — F4321 Adjustment disorder with depressed mood: Secondary | ICD-10-CM | POA: Diagnosis not present

## 2019-08-03 DIAGNOSIS — R456 Violent behavior: Secondary | ICD-10-CM | POA: Diagnosis not present

## 2019-08-03 DIAGNOSIS — R4689 Other symptoms and signs involving appearance and behavior: Secondary | ICD-10-CM | POA: Diagnosis present

## 2019-08-03 DIAGNOSIS — R443 Hallucinations, unspecified: Secondary | ICD-10-CM | POA: Insufficient documentation

## 2019-08-03 DIAGNOSIS — G3184 Mild cognitive impairment, so stated: Secondary | ICD-10-CM | POA: Diagnosis not present

## 2019-08-03 DIAGNOSIS — I10 Essential (primary) hypertension: Secondary | ICD-10-CM | POA: Insufficient documentation

## 2019-08-03 DIAGNOSIS — Z03818 Encounter for observation for suspected exposure to other biological agents ruled out: Secondary | ICD-10-CM | POA: Diagnosis not present

## 2019-08-03 HISTORY — DX: Unspecified dementia, unspecified severity, without behavioral disturbance, psychotic disturbance, mood disturbance, and anxiety: F03.90

## 2019-08-03 NOTE — ED Triage Notes (Signed)
Patient is IVC'd by family. Patients family wants an eval by psych and a metabolic panel done. Patient is not alert to place. Patient has difficulty walking.

## 2019-08-03 NOTE — ED Provider Notes (Signed)
[facility] DEPT Provider Note   CSN: [id number] Arrival date & time: 08/03/19  2058     History Chief Complaint  Patient presents with  . IVC  . Medical Clearance    Kristen Goodman is a 85 y.o. female.  Level 5 caveat for dementia.  Patient brought in under IVC by family.  He reports she has been seeing things are not there as well as people that are not there and wandering at night.  She lives alone with her nephew visiting some of the time.  Patient is alert and oriented to person and place on arrival.  She denies complaints.  She denies headache, chest pain, shortness of breath, abdominal pain, nausea or vomiting.  She states she is taking her medicines.  Patient discharged in the hospital 2 days ago after admission for altered mental status most likely multifactorial.  She was found to have a pelvic mass which needs follow-up with gynecology.  She was thought to have some element of dementia with possibly superimposed psychosis.  Additional history obtained from patient's [place].  Evangeline states patient has had increasing violent behavior with wandering and some hallucinations.  They are concerned that she needs psychiatric placement.  No report of suicidal homicidal thoughts but she has been violent towards family members.  No recent medication changes.  Family is aware of her pelvic mass and need for follow-up.  The history is provided by the patient and a relative.       Past Medical History:  Diagnosis Date  . Arthritis    knee  . Dementia ([place])   . Hyperlipidemia 03/09/1995  . Hypertension 05/08/1978  . Memory changes    with hallucinations    Patient Active Problem List   Diagnosis Date Noted  . Acute metabolic encephalopathy [id number]  . Dementia with behavioral disturbance ([place]) 08/01/2019  . Hypernatremia 08/01/2019  . Hyperbilirubinemia 08/01/2019  . Hyperammonemia ([place]) 08/01/2019  . Sinus  bradycardia 08/01/2019  . Obesity (BMI 30-39.9) 08/01/2019  . Hepatitis A 07/31/2019  . AMS (altered mental status) 07/30/2019  . Increased ammonia level   . Oral pain 05/21/2019  . Neck pain 05/21/2019  . Muscle pain 03/21/2017  . Healthcare maintenance 03/15/2016  . Advance care planning 01/24/2015  . Syncope 11/25/2014  . LBP (low back pain) 05/30/2013  . Obesity, unspecified 12/24/2012  . Medicare annual wellness visit, subsequent 06/02/2012  . Hallucinations 01/03/2012  . Memory changes 12/25/2011  . Shoulder pain 11/27/2011  . Renal insufficiency 11/23/2010  . History of gout 02/28/2010  . Vitamin D deficiency 03/10/2009  . 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 Hyperglycemia 06/08/2001  . Hyperlipemia 03/09/1995  . HTN (hypertension) 05/08/1978    Past Surgical History:  Procedure Laterality Date  . ABDOMINAL HYSTERECTOMY  07/04/1985   HRT s/p partial hyst for dysmenorrhea/ menopause  . CATARACT EXTRACTION Bilateral      OB History   No obstetric history on file.     Family History  Problem Relation Age of Onset  . Hypertension Mother   . Diabetes Father   . Hypertension Father   . Heart disease Sister   . Diabetes Brother   . Diabetes Brother   . Cancer Neg Hx   . Alcohol abuse Neg Hx   . Depression Neg Hx   . Breast cancer Neg Hx   . Colon cancer Neg Hx     Social History   Tobacco Use  . Smoking status: Never Smoker  .  Smokeless tobacco: Never Used  Substance Use Topics  . Alcohol use: No    Alcohol/week: 0.0 standard drinks  . Drug use: No    Home Medications Prior to Admission medications   Medication Sig Start Date End Date Taking? Authorizing Provider  acetaminophen (TYLENOL) 500 MG tablet Take 500 mg by mouth as needed for mild pain or moderate pain.     [provider]  ARIPiprazole (ABILIFY) 5 MG tablet Take 0.5 tablets (2.5 mg total) by mouth daily. Patient taking differently: Take 5 mg by mouth daily.  05/20/19    Tonia Ghent, MD  Cholecalciferol 2000 units TABS Take 1 tablet (2,000 Units total) by mouth daily. 03/14/16   Tonia Ghent, MD  diclofenac Sodium (VOLTAREN) 1 % GEL Apply 2 g topically 4 (four) times daily. 08/01/19   Guilford Shi, MD  fluticasone (FLONASE) 50 MCG/ACT nasal spray Place 2 sprays into both nostrils daily. Patient taking differently: Place 2 sprays into both nostrils as needed for allergies.  10/23/17   Tonia Ghent, MD  furosemide (LASIX) 40 MG tablet Take 1 tablet (40 mg total) by mouth as needed for fluid or edema. 08/01/19   Guilford Shi, MD  hydrALAZINE (APRESOLINE) 25 MG tablet Take 1 tablet (25 mg total) by mouth 2 (two) times daily. 08/01/19   Guilford Shi, MD  OLANZapine zydis (ZYPREXA) 5 MG disintegrating tablet Take 1 tablet (5 mg total) by mouth daily as needed for up to 10 days (agitation). 08/01/19 August 25, 2019  Guilford Shi, MD  rosuvastatin (CRESTOR) 5 MG tablet TAKE 1 TABLET BY MOUTH EVERY DAY Patient taking differently: Take 5 mg by mouth daily.  05/20/19   Tonia Ghent, MD  traZODone (DESYREL) 50 MG tablet Take 1 tablet (50 mg total) by mouth at bedtime. 08/01/19   Guilford Shi, MD    Allergies    Risperdal [risperidone]  Review of Systems   Review of Systems  Constitutional: Negative for activity change, appetite change and fever.  HENT: Negative for congestion and rhinorrhea.   Eyes: Negative for visual disturbance.  Respiratory: Negative for cough, chest tightness and shortness of breath.   Cardiovascular: Negative for chest pain.  Gastrointestinal: Negative for abdominal pain, nausea and vomiting.  Genitourinary: Negative for dysuria.  Musculoskeletal: Negative for arthralgias and myalgias.  Skin: Negative for rash.  Neurological: Negative for dizziness, weakness and headaches.  Psychiatric/Behavioral: Positive for agitation, behavioral problems, confusion and hallucinations. Negative for suicidal ideas. The patient is  nervous/anxious.     all other systems are negative except as noted in the HPI and PMH.   Physical Exam Updated Vital Signs BP (!) 150/85 (BP Location: Left Arm)   Pulse (!) 101   Temp 98.2 F (36.8 C) (Oral)   Resp 18   SpO2 100%   Physical Exam Vitals and nursing note reviewed.  Constitutional:      General: She is not in acute distress.    Appearance: She is well-developed.     Comments: Calm and cooperative  HENT:     Head: Normocephalic and atraumatic.     Mouth/Throat:     Pharynx: No oropharyngeal exudate.  Eyes:     Conjunctiva/sclera: Conjunctivae normal.     Pupils: Pupils are equal, round, and reactive to light.  Neck:     Comments: No meningismus. Cardiovascular:     Rate and Rhythm: Normal rate and regular rhythm.     Heart sounds: Normal heart sounds. No murmur.  Pulmonary:  Effort: Pulmonary effort is normal. No respiratory distress.     Breath sounds: Normal breath sounds.  Abdominal:     Palpations: Abdomen is soft.     Tenderness: There is no abdominal tenderness. There is no guarding or rebound.  Musculoskeletal:        General: No tenderness. Normal range of motion.     Cervical back: Normal range of motion and neck supple.  Skin:    General: Skin is warm.  Neurological:     Mental Status: She is alert.     Cranial Nerves: No cranial nerve deficit.     Motor: No abnormal muscle tone.     Coordination: Coordination normal.     Comments:  Oriented to person and place 5/5 strength throughout, no pronator drift, able to lift legs off bed bilaterally.  No ataxia on finger-to-nose.  Psychiatric:        Behavior: Behavior normal.     ED Results / Procedures / Treatments   Labs (all labs ordered are listed, but only abnormal results are displayed) Labs Reviewed  COMPREHENSIVE METABOLIC PANEL - Abnormal; Notable for the following components:      Result Value   Sodium 146 (*)    Glucose, Bld 123 (*)    Creatinine, Ser 1.14 (*)    GFR calc  non Af Amer 45 (*)    GFR calc Af Amer 52 (*)    All other components within normal limits  SALICYLATE LEVEL - Abnormal; Notable for the following components:   Salicylate Lvl [id number] (*)    All other components within normal limits  ACETAMINOPHEN LEVEL - Abnormal; Notable for the following components:   Acetaminophen (Tylenol), Serum <10 (*)    All other components within normal limits  CBC - Abnormal; Notable for the following components:   RBC 5.39 (*)    Hemoglobin 15.1 (*)    HCT 46.9 (*)    Platelets 149 (*)    All other components within normal limits  TROPONIN I (HIGH SENSITIVITY) - Abnormal; Notable for the following components:   Troponin I (High Sensitivity) 18 (*)    All other components within normal limits  RESPIRATORY PANEL BY RT PCR (FLU A&B, COVID)  ETHANOL  RAPID URINE DRUG SCREEN, HOSP PERFORMED  URINALYSIS, ROUTINE W REFLEX MICROSCOPIC  AMMONIA  TSH  TROPONIN I (HIGH SENSITIVITY)    EKG EKG Interpretation  Date/Time:  Sunday August 03 2019 23:57:18 EDT Ventricular Rate:  76 PR Interval:  140 QRS Duration: 130 QT Interval:  450 QTC Calculation: 506 R Axis:   -57 Text Interpretation: Normal sinus rhythm with sinus arrhythmia Right bundle branch block Left anterior fascicular block * Bifascicular block * Minimal voltage criteria for LVH, may be normal variant ( R in aVL ) Abnormal ECG No significant change was found Confirmed by Ezequiel Essex [phone]) on 08/04/2019 12:04:25 AM   Radiology No results found.  Procedures Procedures (including critical care time)  Medications Ordered in ED Medications - No data to display  ED Course  I have reviewed the triage vital signs and the nursing notes.  Pertinent labs & imaging results that were available during my care of the patient were reviewed by me and considered in my medical decision making (see chart for details).    MDM Rules/Calculators/A&P                     Patient with recent admission for  altered mental status.  Now IVC by  family members for hallucinations, wandering behavior and intermittent violent behavior.  Stepdaughter states patient has never been diagnosed with dementia but this is listed in her chart.  She is concerned she is having other psychotic break.  Patient did have a CT head on March 24 that was unremarkable.  Labs today are reassuring with normal electrolytes.  Ammonia has normalized.  Urinalysis pending.  Patient appears medically clear for psychiatry evaluation  Urinalysis negative.  Labs appear to be at baseline.  Second troponin pending.  Holding orders placed.  Patient medically clear for psychiatric evaluation.  Final Clinical Impression(s) / ED Diagnoses Final diagnoses:  None    Rx / DC Orders ED Discharge Orders    None       Kierah Goatley, Annie Main, MD 08/04/19 [phone]

## 2019-08-04 DIAGNOSIS — G3184 Mild cognitive impairment, so stated: Secondary | ICD-10-CM | POA: Diagnosis not present

## 2019-08-04 DIAGNOSIS — I10 Essential (primary) hypertension: Secondary | ICD-10-CM | POA: Diagnosis not present

## 2019-08-04 DIAGNOSIS — R456 Violent behavior: Secondary | ICD-10-CM | POA: Diagnosis not present

## 2019-08-04 DIAGNOSIS — Z03818 Encounter for observation for suspected exposure to other biological agents ruled out: Secondary | ICD-10-CM | POA: Diagnosis not present

## 2019-08-04 LAB — CBC
HCT: 46.9 % — ABNORMAL HIGH (ref 36.0–46.0)
Hemoglobin: 15.1 g/dL — ABNORMAL HIGH (ref 12.0–15.0)
MCH: 28 pg (ref 26.0–34.0)
MCHC: 32.2 g/dL (ref 30.0–36.0)
MCV: 87 fL (ref 80.0–100.0)
Platelets: 149 10*3/uL — ABNORMAL LOW (ref 150–400)
RBC: 5.39 MIL/uL — ABNORMAL HIGH (ref 3.87–5.11)
RDW: 14 % (ref 11.5–15.5)
WBC: 4.2 10*3/uL (ref 4.0–10.5)
nRBC: 0 % (ref 0.0–0.2)

## 2019-08-04 LAB — RESPIRATORY PANEL BY RT PCR (FLU A&B, COVID)
Influenza A by PCR: NEGATIVE
Influenza B by PCR: NEGATIVE
SARS Coronavirus 2 by RT PCR: NEGATIVE

## 2019-08-04 LAB — RAPID URINE DRUG SCREEN, HOSP PERFORMED
Amphetamines: NOT DETECTED
Barbiturates: NOT DETECTED
Benzodiazepines: NOT DETECTED
Cocaine: NOT DETECTED
Opiates: NOT DETECTED
Tetrahydrocannabinol: NOT DETECTED

## 2019-08-04 LAB — COMPREHENSIVE METABOLIC PANEL
ALT: 27 U/L (ref 0–44)
AST: 27 U/L (ref 15–41)
Albumin: 4.1 g/dL (ref 3.5–5.0)
Alkaline Phosphatase: 44 U/L (ref 38–126)
Anion gap: 11 (ref 5–15)
BUN: 19 mg/dL (ref 8–23)
CO2: 24 mmol/L (ref 22–32)
Calcium: 9.5 mg/dL (ref 8.9–10.3)
Chloride: 111 mmol/L (ref 98–111)
Creatinine, Ser: 1.14 mg/dL — ABNORMAL HIGH (ref 0.44–1.00)
GFR calc Af Amer: 52 mL/min — ABNORMAL LOW (ref >60)
GFR calc non Af Amer: 45 mL/min — ABNORMAL LOW (ref >60)
Glucose, Bld: 123 mg/dL — ABNORMAL HIGH (ref 70–99)
Potassium: 3.7 mmol/L (ref 3.5–5.1)
Sodium: 146 mmol/L — ABNORMAL HIGH (ref 135–145)
Total Bilirubin: 1.2 mg/dL (ref 0.3–1.2)
Total Protein: 7.4 g/dL (ref 6.5–8.1)

## 2019-08-04 LAB — URINALYSIS, ROUTINE W REFLEX MICROSCOPIC
Bilirubin Urine: NEGATIVE
Glucose, UA: NEGATIVE mg/dL
Hgb urine dipstick: NEGATIVE
Ketones, ur: NEGATIVE mg/dL
Leukocytes,Ua: NEGATIVE
Nitrite: NEGATIVE
Protein, ur: NEGATIVE mg/dL
Specific Gravity, Urine: 1.012 (ref 1.005–1.030)
pH: 5 (ref 5.0–8.0)

## 2019-08-04 LAB — TROPONIN I (HIGH SENSITIVITY)
Troponin I (High Sensitivity): 18 ng/L — ABNORMAL HIGH (ref <18)
Troponin I (High Sensitivity): 21 ng/L — ABNORMAL HIGH (ref <18)

## 2019-08-04 LAB — ACETAMINOPHEN LEVEL: Acetaminophen (Tylenol), Serum: <10 ug/mL — ABNORMAL LOW (ref 10–30)

## 2019-08-04 LAB — AMMONIA: Ammonia: 16 umol/L (ref 9–35)

## 2019-08-04 LAB — SALICYLATE LEVEL: Salicylate Lvl: <7 mg/dL — ABNORMAL LOW (ref 7.0–30.0)

## 2019-08-04 LAB — ETHANOL: Alcohol, Ethyl (B): <10 mg/dL (ref <10)

## 2019-08-04 MED ORDER — ARIPIPRAZOLE 5 MG PO TABS
5.0000 mg | ORAL_TABLET | Freq: Every day | ORAL | Status: DC
  Administered 2019-08-04 – 2019-08-05 (×2): 5 mg via ORAL
  Filled 2019-08-04 (×2): qty 1

## 2019-08-04 MED ORDER — OLANZAPINE 5 MG PO TBDP: 5.0000 mg | ORAL_TABLET | Freq: Every day | ORAL | Status: DC | PRN

## 2019-08-04 MED ORDER — HYDRALAZINE HCL 10 MG PO TABS
25.0000 mg | ORAL_TABLET | Freq: Two times a day (BID) | ORAL | Status: DC
  Administered 2019-08-04 – 2019-08-05 (×4): 25 mg via ORAL
  Filled 2019-08-04 (×4): qty 3

## 2019-08-04 MED ORDER — ROSUVASTATIN CALCIUM 5 MG PO TABS
5.0000 mg | ORAL_TABLET | Freq: Every day | ORAL | Status: DC
  Administered 2019-08-04 – 2019-08-05 (×2): 5 mg via ORAL
  Filled 2019-08-04 (×3): qty 1

## 2019-08-04 MED ORDER — TRAZODONE HCL 50 MG PO TABS
50.0000 mg | ORAL_TABLET | Freq: Every day | ORAL | Status: DC
  Administered 2019-08-04 (×2): 50 mg via ORAL
  Filled 2019-08-04 (×2): qty 1

## 2019-08-04 NOTE — BH Assessment (Signed)
Tele Assessment Note   Patient Name: Kristen Goodman MRN: [id number] Referring Physician: Ezequiel Essex, MD Location of Patient: [facility] Location of Provider: [place] is an 85 y.o. female who presents to the ED under IVC initiated by her stepdaughter. Per IVC, respondent "is seeing little men walking across the floor. She thinks a woman is in the living room. She is leaving the house in the middle of the night." TTS spoke with the petitioner of the IVC who states the pt has similar episodes multiple times in the past. Petitioner reports this particular episode has been ongoing for about 2 weeks. She reports the pt has been talking with herself, telling people that others have died when they have not, and walking around the home naked in the presence of her nephew. Petitioner reports the pt grabbed scissors and told her she was going to cut herself. Petitioner also reports the pt threw a chair and has been increasingly erratic.   TTS spoke with the pt who is pleasant and cooperative. Pt states she lives with her nephew and denies any recent stressors or triggers. Pt states she has been having trouble sleeping lately and reports increased insomnia. Pt states she has received OPT tx in the past but she does not recall when or where. Petitioner of the IVC reports the pt may have been admitted to [facility] in 2010 but she does not recall. Pt states she does not know why she is in the ED and reports she has been having trouble sleeping for 2 weeks.  Caroline Sauger, NP recommends inpt tx at a geropsych facility. TTS to seek placement. EDP Rancour, Annie Main, MD and Moscinski, Ellie Lunch, RN have been advised.  Diagnosis: Mild neurocognitive disorder due to another medical condition  Past Medical History:  Past Medical History:  Diagnosis Date  . Arthritis    knee  . Dementia ([place])   . Hyperlipidemia 03/09/1995  . Hypertension 05/08/1978  . Memory  changes    with hallucinations    Past Surgical History:  Procedure Laterality Date  . ABDOMINAL HYSTERECTOMY  07/04/1985   HRT s/p partial hyst for dysmenorrhea/ menopause  . CATARACT EXTRACTION Bilateral     Family History:  Family History  Problem Relation Age of Onset  . Hypertension Mother   . Diabetes Father   . Hypertension Father   . Heart disease Sister   . Diabetes Brother   . Diabetes Brother   . Cancer Neg Hx   . Alcohol abuse Neg Hx   . Depression Neg Hx   . Breast cancer Neg Hx   . Colon cancer Neg Hx     Social History:  reports that she has never smoked. She has never used smokeless tobacco. She reports that she does not drink alcohol or use drugs.  Additional Social History:  Alcohol / Drug Use Pain Medications: See MAR Prescriptions: See MAR Over the Counter: See MAR History of alcohol / drug use?: No history of alcohol / drug abuse  CIWA: CIWA-Ar BP: (!) 150/85 Pulse Rate: (!) 101 COWS:    Allergies:  Allergies  Allergen Reactions  . Risperdal [Risperidone] Other (See Comments)    Worsening hallucinations.     Home Medications: (Not in a hospital admission)   OB/GYN Status:  No LMP recorded. Patient has had a hysterectomy.  General Assessment Data [facility]: [facility] TTS Assessment: In system Is this a Tele or Face-to-Face Assessment?: Tele Assessment Is this  an Initial Assessment or a Re-assessment for this encounter?: Initial Assessment Patient Accompanied by:: N/A Language Other than English: No Living Arrangements: Other (Comment) What gender do you identify as?: Female Marital status: Widowed Pregnancy Status: No Living Arrangements: Other relatives(nephew) Can pt return to current living arrangement?: Yes Admission Status: Involuntary Petitioner: Family member Is patient capable of signing voluntary admission?: No Referral Source: Self/Family/Friend Insurance type: [facility]     Crisis Care Plan Living  Arrangements: Other relatives(nephew) Name of Psychiatrist: none Name of Therapist: none  Education Status Is patient currently in school?: No Is the patient employed, unemployed or receiving disability?: Receiving disability income  Risk to self with the past 6 months Suicidal Ideation: No Has patient been a risk to self within the past 6 months prior to admission? : Yes Suicidal Intent: No Has patient had any suicidal intent within the past 6 months prior to admission? : No Is patient at risk for suicide?: No Suicidal Plan?: No Has patient had any suicidal plan within the past 6 months prior to admission? : No Access to Means: No What has been your use of drugs/alcohol within the last 12 months?: denies Previous Attempts/Gestures: No Triggers for Past Attempts: None known Intentional Self Injurious Behavior: None Family Suicide History: No Recent stressful life event(s): Other (Comment)(psychosis) Persecutory voices/beliefs?: No Depression: No Substance abuse history and/or treatment for substance abuse?: No Suicide prevention information given to non-admitted patients: Not applicable  Risk to Others within the past 6 months Homicidal Ideation: No Does patient have any lifetime risk of violence toward others beyond the six months prior to admission? : No Thoughts of Harm to Others: No Current Homicidal Intent: No Current Homicidal Plan: No Access to Homicidal Means: No History of harm to others?: No Assessment of Violence: None Noted Does patient have access to weapons?: No Criminal Charges Pending?: No Does patient have a court date: No Is patient on probation?: No  Psychosis Hallucinations: Auditory, Visual Delusions: Unspecified  Mental Status Report Appearance/Hygiene: In hospital gown Eye Contact: Good Motor Activity: Unsteady Speech: Slow, Soft Level of Consciousness: Quiet/awake Mood: Euthymic Affect: Flat Anxiety Level: None Thought Processes:  Relevant, Coherent Judgement: Partial Orientation: Person, Time, Place Obsessive Compulsive Thoughts/Behaviors: None  Cognitive Functioning Concentration: Normal Memory: Recent Impaired, Remote Impaired Is patient IDD: No Insight: Fair Impulse Control: Fair Appetite: Good Have you had any weight changes? : No Change Sleep: Decreased Total Hours of Sleep: 2 Vegetative Symptoms: None  ADLScreening [facility] Assessment Services) Patient's cognitive ability adequate to safely complete daily activities?: Yes Patient able to express need for assistance with ADLs?: Yes Independently performs ADLs?: No  Prior Inpatient Therapy Prior Inpatient Therapy: Yes Prior Therapy Dates: unk Prior Therapy Facilty/Provider(s): [facility] Reason for Treatment: unk  Prior Outpatient Therapy Prior Outpatient Therapy: No Does patient have an ACCT team?: No Does patient have Intensive In-House Services?  : No Does patient have [facility] services? : No Does patient have P4CC services?: No  ADL Screening (condition at time of admission) Patient's cognitive ability adequate to safely complete daily activities?: Yes Is the patient deaf or have difficulty hearing?: Yes Does the patient have difficulty seeing, even when wearing glasses/contacts?: Yes Does the patient have difficulty concentrating, remembering, or making decisions?: Yes Patient able to express need for assistance with ADLs?: Yes Does the patient have difficulty dressing or bathing?: Yes Independently performs ADLs?: No Communication: Independent Dressing (OT): Independent Grooming: Independent Feeding: Independent Bathing: Independent Toileting: Independent In/Out Bed: Needs assistance Is this a change  from baseline?: Pre-admission baseline Walks in Home: Needs assistance Is this a change from baseline?: Pre-admission baseline Does the patient have difficulty walking or climbing stairs?: Yes Weakness of Legs: Both Weakness of Arms/Hands:  None  Home Assistive Devices/Equipment Home Assistive Devices/Equipment: Cane (specify quad or straight)    Abuse/Neglect Assessment (Assessment to be complete while patient is alone) Abuse/Neglect Assessment Can Be Completed: Yes Physical Abuse: Denies Verbal Abuse: Denies Sexual Abuse: Denies Exploitation of patient/patient's resources: Denies Self-Neglect: Denies     Regulatory affairs officer (For Healthcare) Does Patient Have a Medical Advance Directive?: Yes Type of Advance Directive: Living will          Disposition: Caroline Sauger, NP recommends inpt tx at a geropsych facility. TTS to seek placement.  Disposition Initial Assessment Completed for this Encounter: Yes Disposition of Patient: Admit Type of inpatient treatment program: Adult(gero-psych) Patient refused recommended treatment: No  This service was provided via telemedicine using a 2-way, interactive audio and video technology.  Names of all persons participating in this telemedicine service and their role in this encounter. Name:  Kristen Goodman Role: Patient  Name: Jaymes Graff Role: Stepdaughter  Name: Caroline Sauger, NP Role: [facility] Provider   Name: Lind Covert, LCSW Role: TTS    Lyanne Co 08/04/2019 2:25 AM

## 2019-08-04 NOTE — ED Notes (Signed)
Pt currently eating dinner tray that was provided. Pt is relaxed, will continue to monitor

## 2019-08-04 NOTE — BH Assessment (Signed)
[place] Assessment Progress Note  Per Hampton Abbot, MD, this pt requires psychiatric hospitalization at this time.  Pt presents under IVC initiated pt's step-daughter, and upheld by Dr Dwyane Dee.  The following facilities have been contacted to seek placement for this pt, with results as noted:  Beds available, information sent, decision pending: Theodoro Clock, [place] Coordinator [phone]

## 2019-08-04 NOTE — ED Notes (Signed)
Pt asleep and resting comfortably at the current time

## 2019-08-04 NOTE — ED Notes (Signed)
EMT Cynda Familia both attempted to get second troponin for this patient. Patient is a hard stick. This RN attempted to contact phlebotomy x1 with no answer.

## 2019-08-04 NOTE — ED Notes (Signed)
Pt. Received a dinner tray. Nurse aware.

## 2019-08-04 NOTE — Progress Notes (Signed)
Kristen Sauger, NP recommends inpt tx at a geropsych facility. TTS to seek placement. EDP Rancour, Annie Main, MD and Moscinski, Ellie Lunch, RN have been advised.

## 2019-08-05 DIAGNOSIS — G3184 Mild cognitive impairment, so stated: Secondary | ICD-10-CM | POA: Diagnosis not present

## 2019-08-05 DIAGNOSIS — F4321 Adjustment disorder with depressed mood: Secondary | ICD-10-CM

## 2019-08-05 MED ORDER — ARIPIPRAZOLE 5 MG PO TABS: 5.0000 mg | ORAL_TABLET | Freq: Every day | ORAL | 0 refills | Status: AC

## 2019-08-05 NOTE — Discharge Instructions (Signed)
When facing the loss of a loved one, many people find comfort in grief counseling.  Energy manager to ask about enrolling in their grief counseling program:       [facility] [place]      [address] [place], [place] [address]      [phone]

## 2019-08-05 NOTE — ED Notes (Signed)
Attempted to contact family regarding transport home. No answer not answering machine picked up.

## 2019-08-05 NOTE — ED Notes (Signed)
Reclining chair brought to patient's bedside. Patient declined to be moved from bed to chair.

## 2019-08-05 NOTE — ED Notes (Signed)
Pt given a meal tray 

## 2019-08-05 NOTE — Consult Note (Signed)
[facility] Psych ED Discharge  08/05/2019 2:01 PM Kristen Goodman  MRN:  [id number] Principal Problem: Adjustment disorder with depressed mood Discharge Diagnoses: Principal Problem:   Adjustment disorder with depressed mood   Subjective: Kristen Goodman, 85 y.o., female patient seen via tele psych by this provider, Dr. Dwyane Dee; and chart reviewed on 08/05/19.  On evaluation Kristen Goodman reports she is feeling fine.  States that she is eating and sleeping without difficulty.  Patient continues to deny suicidal/self-harm/homicidal ideation, psychosis, and paranoia.  Patient gave permission to speak to her nephew and states that she is ready to go home.  States that she was able to take care of her self at home pretty good but not able to do like she use to.  Patient interested in therapy to help with depression related to death of her brother  During evaluation Kristen Goodman is alert/oriented x 4; calm/cooperative; and mood is congruent with affect. She does not appear to be responding to internal/external stimuli or delusional thoughts.  Patient denies suicidal/self-harm/homicidal ideation, psychosis, and paranoia.  Patient answered question appropriately.  Nursing reports that patient has done well through out the night and have not notice any response to internal/external stimuli; and no behavioral outburst.     Total Time spent with patient: 20 minutes  Past Psychiatric History: Denies  Past Medical History:  Past Medical History:  Diagnosis Date  . Arthritis    knee  . Dementia ([place])   . Hyperlipidemia 03/09/1995  . Hypertension 05/08/1978  . Memory changes    with hallucinations    Past Surgical History:  Procedure Laterality Date  . ABDOMINAL HYSTERECTOMY  07/04/1985   HRT s/p partial hyst for dysmenorrhea/ menopause  . CATARACT EXTRACTION Bilateral    Family History:  Family History  Problem Relation Age of Onset  . Hypertension Mother   . Diabetes Father    . Hypertension Father   . Heart disease Sister   . Diabetes Brother   . Diabetes Brother   . Cancer Neg Hx   . Alcohol abuse Neg Hx   . Depression Neg Hx   . Breast cancer Neg Hx   . Colon cancer Neg Hx    Family Psychiatric  History: Unaware Social History:  Social History   Substance and Sexual Activity  Alcohol Use No  . Alcohol/week: 0.0 standard drinks     Social History   Substance and Sexual Activity  Drug Use No    Social History   Socioeconomic History  . Marital status: Widowed    Spouse name: Not on file  . Number of children: Not on file  . Years of education: Not on file  . Highest education level: Not on file  Occupational History  . Not on file  Tobacco Use  . Smoking status: Never Smoker  . Smokeless tobacco: Never Used  Substance and Sexual Activity  . Alcohol use: No    Alcohol/week: 0.0 standard drinks  . Drug use: No  . Sexual activity: Never  Other Topics Concern  . Not on file  Social History Narrative   1 step daughter   Retired, prev at [employer], husband died 10-Aug-2008 after 13 years of marriage.     Lives in her home along with her godson.    Social Determinants of Health   Financial Resource Strain: Low Risk   . Difficulty of Paying Living Expenses: Not hard at all  Food Insecurity: No Food Insecurity  .  Worried About Charity fundraiser in the Last Year: Never true  . Ran Out of Food in the Last Year: Never true  Transportation Needs: No Transportation Needs  . Lack of Transportation (Medical): No  . Lack of Transportation (Non-Medical): No  Physical Activity: Inactive  . Days of Exercise per Week: 0 days  . Minutes of Exercise per Session: 0 min  Stress: No Stress Concern Present  . Feeling of Stress : Not at all  Social Connections:   . Frequency of Communication with Friends and Family:   . Frequency of Social Gatherings with Friends and Family:   . Attends Religious Services:   . Active Member of  Clubs or Organizations:   . Attends Archivist Meetings:   Marland Kitchen Marital Status:     Has this patient used any form of tobacco in the last 30 days? (Cigarettes, Smokeless Tobacco, Cigars, and/or Pipes) Prescription not provided because: Does not use tobacco products  Current Medications: Current Facility-Administered Medications  Medication Dose Route Frequency Provider Last Rate Last Admin  . ARIPiprazole (ABILIFY) tablet 5 mg  5 mg Oral Daily Rancour, Stephen, MD   5 mg at 08/05/19 1114  . hydrALAZINE (APRESOLINE) tablet 25 mg  25 mg Oral BID Ezequiel Essex, MD   25 mg at 08/05/19 1113  . OLANZapine zydis (ZYPREXA) disintegrating tablet 5 mg  5 mg Oral Daily PRN Rancour, Stephen, MD      . rosuvastatin (CRESTOR) tablet 5 mg  5 mg Oral Daily Rancour, Stephen, MD   5 mg at 08/05/19 1114  . traZODone (DESYREL) tablet 50 mg  50 mg Oral QHS Rancour, Annie Main, MD   50 mg at 08/04/19 2346   Current Outpatient Medications  Medication Sig Dispense Refill  . acetaminophen (TYLENOL) 500 MG tablet Take 500 mg by mouth as needed for mild pain or moderate pain.     . ARIPiprazole (ABILIFY) 5 MG tablet Take 0.5 tablets (2.5 mg total) by mouth daily. (Patient taking differently: Take 5 mg by mouth daily. ) 45 tablet 3  . Cholecalciferol 2000 units TABS Take 1 tablet (2,000 Units total) by mouth daily.    . diclofenac Sodium (VOLTAREN) 1 % GEL Apply 2 g topically 4 (four) times daily. (Patient taking differently: Apply 2 g topically 4 (four) times daily as needed (pain). ) 50 g 1  . fluticasone (FLONASE) 50 MCG/ACT nasal spray Place 2 sprays into both nostrils daily. (Patient taking differently: Place 2 sprays into both nostrils as needed for allergies. )    . furosemide (LASIX) 40 MG tablet Take 1 tablet (40 mg total) by mouth as needed for fluid or edema.    . hydrALAZINE (APRESOLINE) 25 MG tablet Take 1 tablet (25 mg total) by mouth 2 (two) times daily. 30 tablet 1  . OLANZapine zydis (ZYPREXA) 5  MG disintegrating tablet Take 1 tablet (5 mg total) by mouth daily as needed for up to 10 days (agitation). 10 tablet 0  . rosuvastatin (CRESTOR) 5 MG tablet TAKE 1 TABLET BY MOUTH EVERY DAY (Patient taking differently: Take 5 mg by mouth daily. ) 90 tablet 3  . traZODone (DESYREL) 50 MG tablet Take 1 tablet (50 mg total) by mouth at bedtime. 20 tablet 0  . [START ON 08/06/2019] ARIPiprazole (ABILIFY) 5 MG tablet Take 1 tablet (5 mg total) by mouth daily. 30 tablet 0   PTA Medications: (Not in a hospital admission)   Musculoskeletal: Strength & Muscle Tone: Unable to assess Gait &  Station: Did not see ambulate Patient leans: Right  Psychiatric Specialty Exam: Physical Exam Vitals and nursing note reviewed. Exam conducted with a chaperone present.  Constitutional:      Appearance: Normal appearance.  Pulmonary:     Effort: Pulmonary effort is normal.  Neurological:     Mental Status: She is alert.  Psychiatric:        Attention and Perception: Attention and perception normal.        Mood and Affect: Mood is depressed (Stable).        Speech: Speech normal.        Behavior: Behavior normal. Behavior is cooperative.        Thought Content: Thought content normal. Thought content is not paranoid or delusional. Thought content does not include homicidal or suicidal ideation.        Cognition and Memory: Cognition and memory normal.        Judgment: Judgment normal.     Review of Systems  Psychiatric/Behavioral: Negative for agitation, behavioral problems, hallucinations, self-injury, sleep disturbance and suicidal ideas. The patient is not nervous/anxious.   All other systems reviewed and are negative.   Blood pressure 115/65, pulse 62, temperature 98.2 F (36.8 C), temperature source Oral, resp. rate 18, SpO2 98 %.There is no height or weight on file to calculate BMI.  General Appearance: Casual  Eye Contact:  Good  Speech:  Clear and Coherent and Normal Rate  Volume:  Decreased   Mood:  Depressed  Affect:  Appropriate and Congruent  Thought Process:  Coherent, Goal Directed, Linear and Descriptions of Associations: Intact  Orientation:  Full (Time, Place, and Person)  Thought Content:  WDL  Suicidal Thoughts:  No  Homicidal Thoughts:  No  Memory:  Immediate;   Fair Recent;   Fair  Judgement:  Intact  Insight:  Present  Psychomotor Activity:  Normal  Concentration:  Concentration: Fair and Attention Span: Fair  Recall:  [employer] of Knowledge:  Fair  Language:  Fair  Akathisia:  No  Handed:  Right  AIMS (if indicated):     Assets:  Communication Skills Desire for Improvement Financial Resources/Insurance Housing Resilience Social Support  ADL's:  Intact  Cognition:  WNL  Sleep:        Demographic Factors:  NA  Loss Factors: Recent loss of brother  Historical Factors: NA  Risk Reduction Factors:   Sense of responsibility to family, Religious beliefs about death, Living with another person, especially a relative and Positive social support  Continued Clinical Symptoms:  Adjustment disorder with depressed mood  Cognitive Features That Contribute To Risk:  None    Suicide Risk:  Minimal: No identifiable suicidal ideation.  Patients presenting with no risk factors but with morbid ruminations; may be classified as minimal risk based on the severity of the depressive symptoms    Plan Of Care/Follow-up recommendations:  Activity:  As tolerated Diet:  Heart healthy     Discharge Instructions     When facing the loss of a loved one, many people find comfort in grief counseling.  Energy manager to ask about enrolling in their grief counseling program:       [facility] [place]      [place], [place] [address]      [phone]      Disposition:  Psychiatrically cleared No evidence of imminent risk to self or others at present.   Patient does not meet criteria for  psychiatric  inpatient admission. Supportive therapy provided about ongoing stressors. Discussed crisis plan, support from social network, calling 911, coming to the Emergency Department, and calling Suicide Hotline.    Yao [place]pp[place], NP 08/05/2019, 2:01 PM

## 2019-08-05 NOTE — BH Assessment (Signed)
Reassessment note: Pt presents sitting upright with eyes partially closed. She speaks softly. Pt denies SI, HI and AVH. No evidence of responding to internal stimuli noted. Pt states she would like to go home & gave verbal consent to speak with her nephew, Saralyn Pilar. By phone, Saralyn Pilar was given contact information for outpt psychiatrists who specialize in geropsychiatry. Saralyn Pilar was advised a social work consult re: home health needs was being ordered and that pt is psychiatrically cleared at this time.

## 2019-08-05 NOTE — ED Notes (Signed)
Pt verbalizes understanding of DC instructions. Pt belongings returned and is ambulatory out of ED.    PTs step daughter verbalized understanding

## 2019-08-05 NOTE — BH Assessment (Addendum)
[place] Assessment Progress Note  Per Hampton Abbot, MD, this pt does not require psychiatric hospitalization at this time.  Pt presents under IVC initiated by pt's step-daughter and upheld by Dr Dwyane Dee on 08/04/2019, but which she has now rescinded.  Pt is to be discharged from [facility] with recommendation to follow up with [facility] for grief counseling.  This has been included in pt's discharge instructions.  A social work consult is to be placed for this pt.  Pt's nurse has been notified.  Jalene Mullet, [place] Triage Specialist [phone]

## 2019-08-05 NOTE — Progress Notes (Cos[place] Needed Addendum)
2:33 pm TOC CM spoke to both daughter and nephew (3 way call). Pt does have a Long Term Care policy that will assist if she need ALF. They have decided to try hiring a personal caregiver while they work. Provided information on how to locate. States she does have information on [facility]. Provided dtr, Evangaline the contact number. She has number to call when she arrives to pick up pt. Updated ED RN. Jonnie Finner RN CCM, [facility] ED TOC CM [phone]   2:01 pm TOC CM attempted call to nephew, Saralyn Pilar. Unable to leave message as mailbox is full. [place] to make aware of dc to continue [place]. [place], [facility] ED TOC CM [phone]

## 2019-08-05 NOTE — ED Notes (Signed)
Updated family on pt's status.

## 2019-08-05 NOTE — ED Notes (Signed)
Spoke with Daughter and nephew neither party is able to come pick up pt until after work house at [employer]. Family reports there is no one at home to let the pt into house at this time. Will continue to follow up.

## 2019-08-06 ENCOUNTER — Telehealth: Payer: Self-pay | Admitting: Family Medicine

## 2019-08-06 NOTE — Telephone Encounter (Signed)
[facility]  Verbal aprroval PT  2 x week for 3 week 1 x week for 2 weeks  Approval for home health aid to come out and  Bath pt 2 x week for 3 weeks  Can leave message

## 2019-08-06 NOTE — Telephone Encounter (Signed)
Please give the order.  Thanks.   

## 2019-08-07 NOTE — Telephone Encounter (Signed)
Left detailed message on voicemail of Kristen Goodman.

## 2019-08-11 ENCOUNTER — Telehealth: Payer: Self-pay | Admitting: *Deleted

## 2019-08-11 DIAGNOSIS — R4020 Unspecified coma: Secondary | ICD-10-CM | POA: Diagnosis not present

## 2019-08-11 DIAGNOSIS — I469 Cardiac arrest, cause unspecified: Secondary | ICD-10-CM | POA: Diagnosis not present

## 2019-08-11 DIAGNOSIS — R404 Transient alteration of awareness: Secondary | ICD-10-CM | POA: Diagnosis not present

## 2019-08-12 ENCOUNTER — Telehealth: Payer: Self-pay

## 2019-08-12 ENCOUNTER — Telehealth: Payer: Self-pay | Admitting: *Deleted

## 2019-08-12 NOTE — Telephone Encounter (Signed)
[place] Night - Client Nonclinical Telephone Record AccessNurse Client [place] Night - Client Client Site [place] Physician AA - PHYSICIAN, Verita Schneiders- MD Contact Type Call Who Is Calling Physician / Provider / Hospital Call Type Provider Call [facility] Page Now Reason for Call Request for Physician Consult Initial Comment Caller is notifying a patient passing. Additional Comment Caller would like to notify the on call of a patient passing and ask them to sign the death certificate. Physician unknown. Patient Name Kristen Goodman Patient DOB 1935-03-28 Requesting Provider Gearldine Bienenstock Physician Number [phone] Facility Name [facility] [place]. Time Disposition Final User 08/24/19 8:12:20 PM Send to [place], [place] Aug 24, 2019 8:23:20 PM Paged On Call back to Call [place], [place] 08/24/2019 8:39:59 PM Paged On Call back to Call [place], [place] August 24, 2019 8:56:03 PM Called On-Call Provider [place], [place] 08/24/2019 8:59:54 PM Page Completed Yes [place], [place] Comments User: Chief of Staff, [place] Date/Time ([place] Time): August 24, 2019 8:59:49 PM Gave the on call the page information. On call declined a call back number. Paging DoctorName Phone DateTime Result/Outcome Message Type Notes Deborra Medina - MD [id number] Aug 24, 2019 8:23:20 PM Called On Call Provider - Left Message Doctor Paged Deborra Medina - MD [id number] 2019-08-24 8:39:59 PM Called On Call Provider - Left Message Doctor Paged Deborra Medina - MD [id number] August 24, 2019 8:56:03 PM Called On Call Provider - Reached Doctor Paged Deborra Medina - MD August 24, 2019 8:59:43 PM Spoke with On Call - General Message Result Gave the on call the page information. On call declined a call back number. Call Closed By: Roxanna Mew Transaction Date/Time: 2019-08-24 8:08:44 PM (ET)

## 2019-08-12 NOTE — Telephone Encounter (Signed)
TOC CM contacted nephew, Ronalee Belts to follow up and learned pt passed away on yesterday. Condolences given to nephew. ED provider team updated.  [place], [place] ED TOC CM [phone]

## 2019-08-12 NOTE — Telephone Encounter (Signed)
Late entry.  Noted. Thanks.

## 2019-08-13 NOTE — Telephone Encounter (Signed)
Late entry. I called her nephew Sharlene Dory yesterday to offer my condolences.  He thanked me for the call.  I was always glad to see this kind lady here in clinic.  He thanked [employer] for caring for her over the years.  She will be missed.

## 2019-08-14 ENCOUNTER — Ambulatory Visit: Payer: Medicare Other | Admitting: Family Medicine

## 2019-09-06 DIAGNOSIS — 419620001 Death: Secondary | SNOMED CT | POA: Diagnosis not present

## 2019-09-06 NOTE — Telephone Encounter (Signed)
Patient's grandson Kristen Goodman called stating that Kristen Goodman has gotten worse since going to [place] stated that patient has barricaded  herself in the house and won't let anyone in. Kristen Goodman stated that he lives with her and he has a key. Kristen Goodman stated that she stayed in bed all day yesterday. Kristen Goodman stated that she went to [facility] and after 48 hours they sent her home. Kristen Goodman stated that he feels that they sent her home because they did not have a bed for her. Kristen Goodman stated that she was suppose to be transferred to a facility in [place], but that did not happen. Kristen Goodman stated that he spoke to a person in [place] yesterday and was told that they were on the way to [place] to pick Mrs. Konicek up. Kristen Goodman stated that they can not come to [place]. Kristen Goodman that Kristen Goodman recommends that he take her back to the ER to be evaluated. Kristen Goodman stated that he is not sure she will go. Kristen Goodman if he thinks that she may harm herself he can get authorities involved and maybe they can convince her to go. Kristen Goodman stated that he had to get the sheriff involved last time and they were very helpful.  Kristen Goodman stated that he is at work and will call her daughter Kristen Goodman and let her know what has been recommended and will get her to go to the house to get patient to the ER.

## 2019-09-06 NOTE — Telephone Encounter (Signed)
TOC CM received call from dtr, Jaymes Graff. States they have concerns about mother and that she has lapsed back into the state she was when she came to the hospital. States she did reach out to [facility] and they were going to accept her for IP Psych from ED. Explained to dtr that pt was cleared by Psych to return home with plan that she would need 24 hour caregivers in the home and [facility]. Family was agreeable to plan but when family attempted to arrange caregiver pt declined to pay and she refused [facility] RN visit. Dtr states she has appt to follow up with PCP on 4/8, but they are concerned about pt being in the home not fully cooperating with family in getting the care she needs. Explained TOC CM will reach out to providers that saw her while she was in ED.   Received call from Dr Dwyane Dee and physician provided recommendations. TOC CM contacted dtr with recommendations  bring her back to ED for emergency medical treatment, or contact PCP's office for office visit. Dr Dwyane Dee explained pt was cleared by IP psych with [facility] and family to follow up with 24 hour caregiver, ALF/Memory [facility]. Pt will need a legal guardian to make decisions. Contacted dtr with recommendations. States the PCP's office has requested they bring her back to ED. CM provided her contact information for [facility] who can provided consultation on how to proceed with Legal Guardianship. She can utilize any attorney firm that specialized in Elder Laws to assist her with necessary paperwork. Dtr states she will reach out to 24 hour caregiver agency to see if they could speak to pt again about getting a caregiver in the home while they work on a long term plan.   [place], [place] ED TOC CM [phone]

## 2019-09-06 DEATH — deceased

## 2020-07-07 IMAGING — DX DG CHEST 2V
2 series · 2 of 2 positions shown · non-contrast
Comparison: 04/06/2014.

CLINICAL DATA: Dizziness and bilateral leg weakness for 2 days.

EXAM:
CHEST - 2 VIEW

[x chest ap]
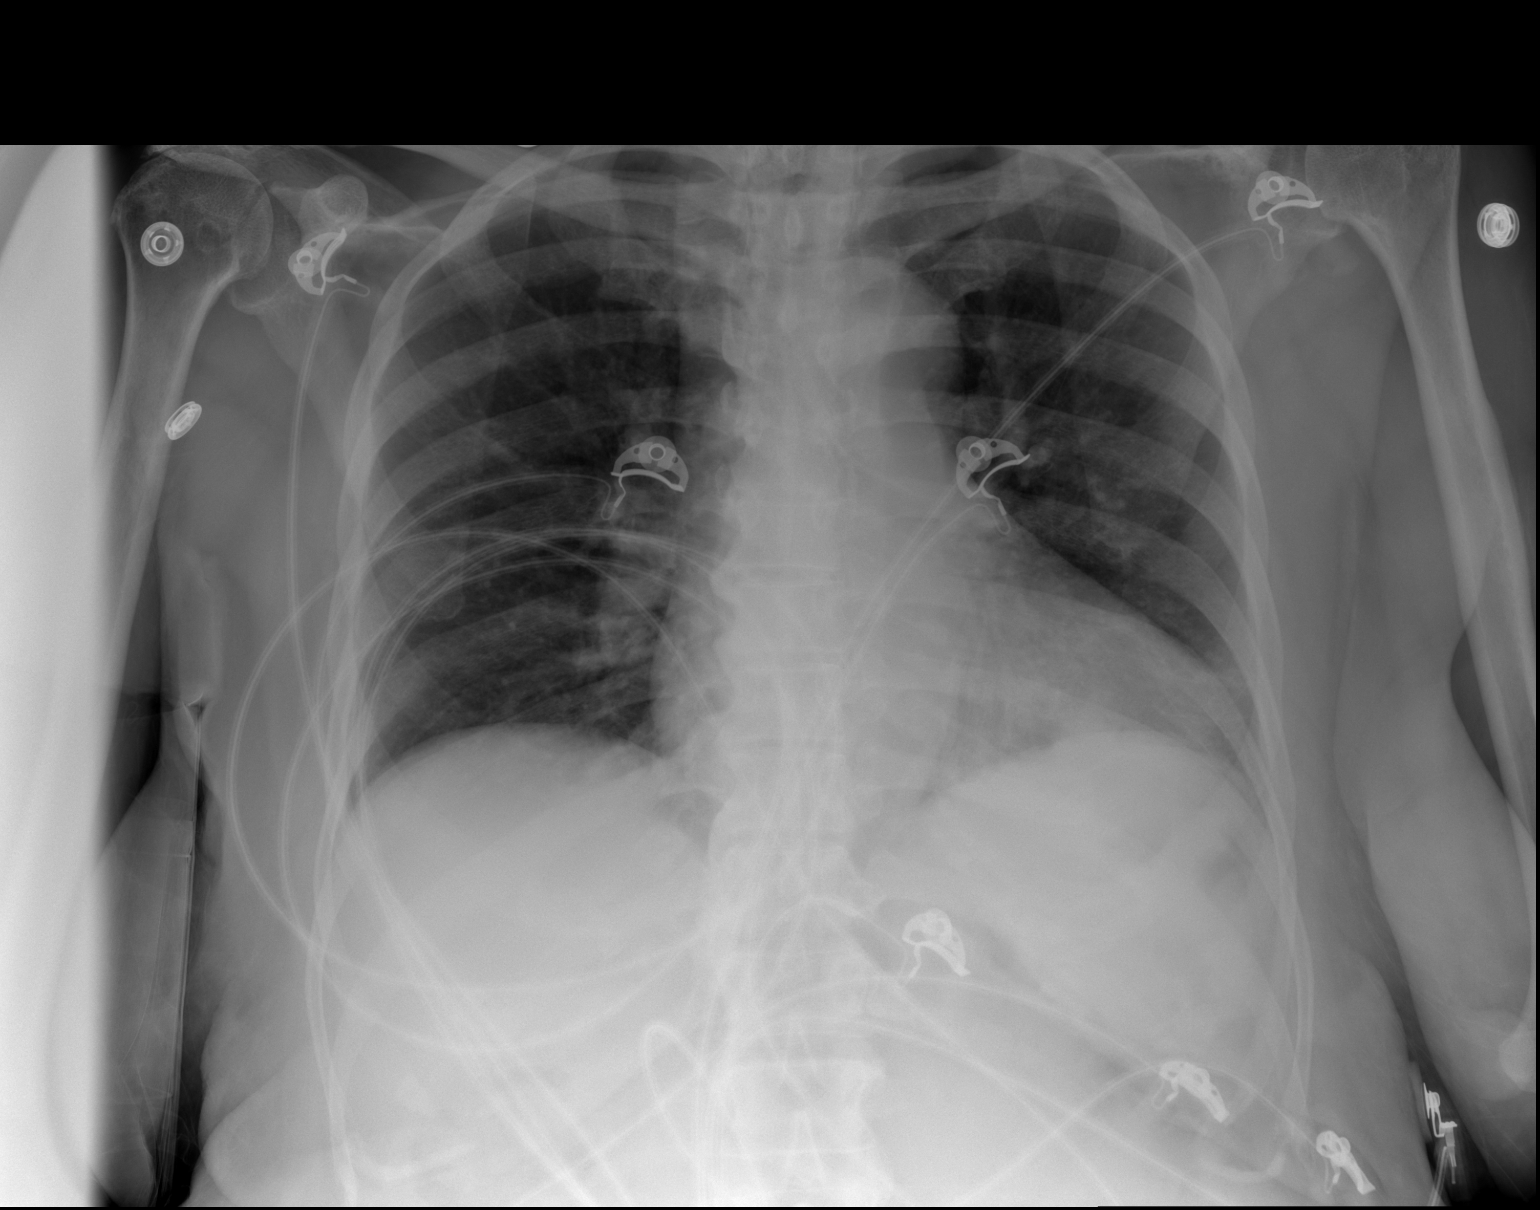

[w chest lat]
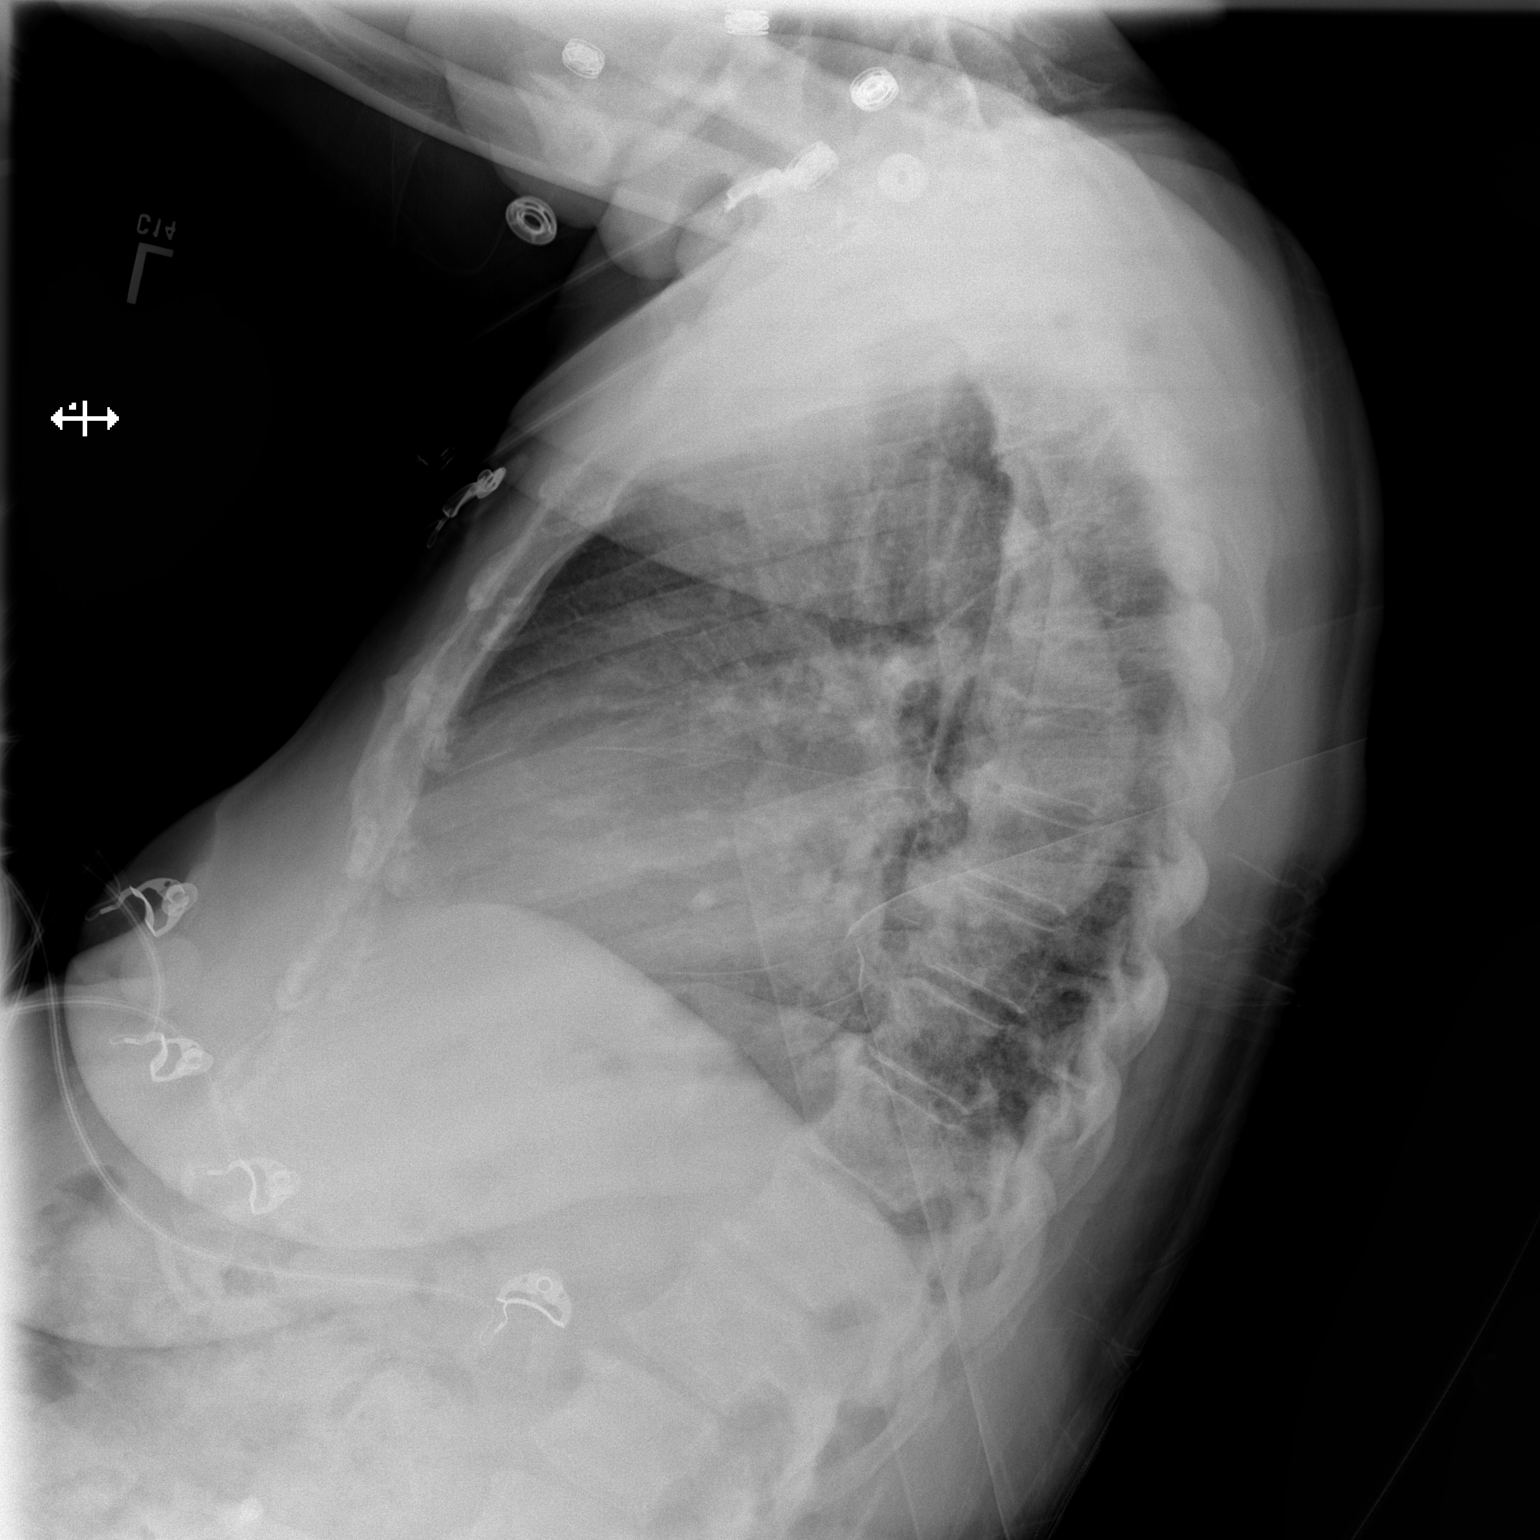

[2 of 2 positions shown; findings below may reference images not displayed]

FINDINGS: Trachea is midline. Heart is mildly enlarged. Linear atelectasis or
scarring in the left lung base. Lungs are otherwise clear. No
pleural fluid. Degenerative changes in the spine and shoulders.
IMPRESSION: No acute findings.
